# Patient Record
Sex: Female | Born: 1990 | Race: White | Hispanic: No | Marital: Married | Smoking: Never smoker
Health system: Southern US, Community
[De-identification: ages and names within clinical notes are randomized; demographics above are authoritative.]

## PROBLEM LIST (undated history)

## (undated) DIAGNOSIS — F419 Anxiety disorder, unspecified: Secondary | ICD-10-CM

## (undated) DIAGNOSIS — N946 Dysmenorrhea, unspecified: Secondary | ICD-10-CM

## (undated) DIAGNOSIS — S62109A Fracture of unspecified carpal bone, unspecified wrist, initial encounter for closed fracture: Secondary | ICD-10-CM

## (undated) DIAGNOSIS — T7840XA Allergy, unspecified, initial encounter: Secondary | ICD-10-CM

## (undated) DIAGNOSIS — F32A Depression, unspecified: Secondary | ICD-10-CM

## (undated) DIAGNOSIS — E781 Pure hyperglyceridemia: Secondary | ICD-10-CM

## (undated) DIAGNOSIS — R5383 Other fatigue: Secondary | ICD-10-CM

## (undated) DIAGNOSIS — E669 Obesity, unspecified: Secondary | ICD-10-CM

## (undated) DIAGNOSIS — F439 Reaction to severe stress, unspecified: Secondary | ICD-10-CM

## (undated) HISTORY — DX: Depression, unspecified: F32.A

## (undated) HISTORY — DX: Reaction to severe stress, unspecified: F43.9

## (undated) HISTORY — DX: Fracture of unspecified carpal bone, unspecified wrist, initial encounter for closed fracture: S62.109A

## (undated) HISTORY — DX: Allergy, unspecified, initial encounter: T78.40XA

## (undated) HISTORY — DX: Dysmenorrhea, unspecified: N94.6

## (undated) HISTORY — DX: Other fatigue: R53.83

## (undated) HISTORY — DX: Pure hyperglyceridemia: E78.1

## (undated) HISTORY — DX: Obesity, unspecified: E66.9

## (undated) HISTORY — PX: WISDOM TOOTH EXTRACTION: SHX21

## (undated) HISTORY — PX: TONSILLECTOMY: SUR1361

---
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
## 2011-03-20 ENCOUNTER — Telehealth: Payer: Self-pay | Admitting: *Deleted

## 2011-03-20 NOTE — Telephone Encounter (Signed)
Pt has a new baby coming into the family and it has been recommended that family members get tdap.  OK to schedule?

## 2011-03-20 NOTE — Telephone Encounter (Signed)
I do recommend that  Go ahead and schedule

## 2011-03-20 NOTE — Telephone Encounter (Signed)
I am forwarding this to [place] to schedule. Thank you.

## 2011-03-21 NOTE — Telephone Encounter (Signed)
Appt made for nurse visit

## 2011-03-22 ENCOUNTER — Ambulatory Visit (INDEPENDENT_AMBULATORY_CARE_PROVIDER_SITE_OTHER): Payer: Federal, State, Local not specified - PPO | Admitting: Family Medicine

## 2011-03-22 DIAGNOSIS — Z289 Immunization not carried out for unspecified reason: Secondary | ICD-10-CM

## 2011-03-22 NOTE — Progress Notes (Signed)
Patient came in today for nurse visit to get Tdap.  Her last Tdap was 05/2010 [facility] at [facility].  I advised patient that she was covered for 10 years and that she didn't need another injection.

## 2011-03-24 DIAGNOSIS — Z289 Immunization not carried out for unspecified reason: Secondary | ICD-10-CM | POA: Insufficient documentation

## 2011-03-24 NOTE — Progress Notes (Signed)
  Subjective:    Patient ID: Bethany Perkins, female    DOB: [birth year], 19 y.o.   MRN: [id number]  HPI    Review of Systems     Objective:   Physical Exam        Assessment & Plan:   No problem-specific assessment & plan notes found for this encounter.

## 2011-04-03 ENCOUNTER — Telehealth: Payer: Self-pay | Admitting: *Deleted

## 2011-04-03 NOTE — Telephone Encounter (Signed)
There is a new baby in the family and they were all advised to get the tdap. Mom is asking if patient can com in for this. Please advise.

## 2011-04-03 NOTE — Telephone Encounter (Signed)
Will send this note back to [facility] to schedule nurse visit.

## 2011-04-03 NOTE — Telephone Encounter (Signed)
Yes-that sounds like a good idea  Please make nurse appt for that

## 2011-04-03 NOTE — Telephone Encounter (Signed)
Nurse visit scheduled for tomorrow.

## 2011-04-04 ENCOUNTER — Ambulatory Visit (INDEPENDENT_AMBULATORY_CARE_PROVIDER_SITE_OTHER): Payer: Federal, State, Local not specified - PPO | Admitting: Family Medicine

## 2011-04-04 DIAGNOSIS — Z23 Encounter for immunization: Secondary | ICD-10-CM

## 2011-04-05 NOTE — Progress Notes (Signed)
  Subjective:    Patient ID: Bethany Perkins, female    DOB: [birth year], 20 y.o.   MRN: [id number]  HPI  Nurse visit.  Review of Systems     Objective:   Physical Exam        Assessment & Plan:

## 2011-04-11 ENCOUNTER — Encounter: Payer: Self-pay | Admitting: Family Medicine

## 2011-04-11 ENCOUNTER — Encounter: Payer: Federal, State, Local not specified - PPO | Admitting: Family Medicine

## 2011-04-12 NOTE — Progress Notes (Signed)
  Subjective:    Patient ID: Bethany Perkins, female    DOB: [birth year], 20 y.o.   MRN: [id number]  HPI  Cancelled   Review of Systems     Objective:   Physical Exam        Assessment & Plan:

## 2011-04-15 ENCOUNTER — Encounter: Payer: Self-pay | Admitting: Family Medicine

## 2011-04-15 ENCOUNTER — Ambulatory Visit (INDEPENDENT_AMBULATORY_CARE_PROVIDER_SITE_OTHER): Payer: Federal, State, Local not specified - PPO | Admitting: Family Medicine

## 2011-04-15 VITALS — BP 102/70 | HR 75 | Temp 98.5°F | Wt 138.5 lb

## 2011-04-15 DIAGNOSIS — J029 Acute pharyngitis, unspecified: Secondary | ICD-10-CM

## 2011-04-15 DIAGNOSIS — J020 Streptococcal pharyngitis: Secondary | ICD-10-CM

## 2011-04-15 MED ORDER — PENICILLIN V POTASSIUM 500 MG PO TABS: 500.0000 mg | ORAL_TABLET | Freq: Three times a day (TID) | ORAL | Status: AC

## 2011-04-15 NOTE — Progress Notes (Signed)
Subjective:     Bethany Perkins is a 20 y.o. female who presents for evaluation of sore throat. Associated symptoms include myalgias, post nasal drip, sinus and nasal congestion and sore throat. Onset of symptoms was 5 days ago, and have been unchanged since that time. She is drinking plenty of fluids. She has not had a recent close exposure to someone with proven streptococcal pharyngitis.  Does get recurrent strep pharyngitis infections.  Patient Active Problem List  Diagnoses  . STREPTOCOCCAL PHARYNGITIS  . ANXIETY DISORDER  . OTHER ACUTE SINUSITIS  . ASTHMA  . DYSMENORRHEA  . Immunization not carried out   Past Medical History  Diagnosis Date  . Allergy     allergic rhinitis seasonal  . Asthma     mild  . Dysmenorrhea   . Fx wrist 06/2003   No past surgical history on file. History  Substance Use Topics  . Smoking status: Never Smoker   . Smokeless tobacco: Not on file  . Alcohol Use: No   No family history on file. No Known Allergies Current Outpatient Prescriptions on File Prior to Visit  Medication Sig Dispense Refill  . drospirenone-ethinyl estradiol (YAZ,GIANVI,LORYNA) 3-0.02 MG tablet Take 1 tablet by mouth daily. at the same time every day       . Multiple Vitamin (MULTIVITAMIN) tablet Take 1 tablet by mouth daily.        Marland Kitchen albuterol (PROAIR HFA) 108 (90 BASE) MCG/ACT inhaler Inhale 2 puffs into the lungs every 4 (four) hours as needed.         The PMH, PSH, Social History, Family History, Medications, and allergies have been reviewed in [facility], and have been updated if relevant.   Review of Systems See HPI  Objective:  BP 102/70  Pulse 75  Temp(Src) 98.5 F (36.9 C) (Oral)  Wt 138 lb 8 oz (62.823 kg)  LMP 04/04/2011 Gen:  Alert, pleasant, NAD HEENT: enlarged, erythematous tonsils with white exudate, right>left, uvula midline Resp:  CTA bilaterally CVS:  RRR Ext:  No edema  Laboratory Strep test done. Results:positive.    Assessment:    Acute  pharyngitis, likely  Strep throat.    Plan:    Patient placed on antibiotics. Use of OTC analgesics recommended as well as salt water gargles. Patient advised that he will be infectious for 24 hours after starting antibiotics. ENT referral given recurrent strep pharyngitis

## 2011-08-07 ENCOUNTER — Telehealth: Payer: Self-pay | Admitting: Internal Medicine

## 2011-08-07 NOTE — Telephone Encounter (Signed)
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

## 2011-08-08 NOTE — Telephone Encounter (Signed)
Patient notified as instructed by telephone. Pt said just needs tb skin test. Scheduled nurse visit 08/14/11 at 11am.

## 2011-08-08 NOTE — Telephone Encounter (Signed)
If she needs a physical or exam , f/u with me  Otherwise make nurse visit for PPD (not on a Thursday)

## 2011-08-13 ENCOUNTER — Ambulatory Visit (INDEPENDENT_AMBULATORY_CARE_PROVIDER_SITE_OTHER): Payer: Federal, State, Local not specified - PPO | Admitting: Family Medicine

## 2011-08-13 ENCOUNTER — Encounter: Payer: Self-pay | Admitting: Family Medicine

## 2011-08-13 VITALS — BP 100/60 | HR 100 | Temp 98.6°F | Ht 68.0 in | Wt 133.0 lb

## 2011-08-13 DIAGNOSIS — J069 Acute upper respiratory infection, unspecified: Secondary | ICD-10-CM

## 2011-08-13 DIAGNOSIS — J029 Acute pharyngitis, unspecified: Secondary | ICD-10-CM

## 2011-08-13 NOTE — Progress Notes (Signed)
  Patient Name: Bethany Perkins Date of Birth: 1991/05/30 Medical Record Number: [id number]  History of Present Illness:  Patent presents with runny nose, sneezing, cough, sore throat, malaise and minimal / low-grade fever .  Chest congestion, tight chest, ST. No fever.  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  + recent exposure to others with similar symptoms.   The patent denies sore throat as the primary complaint. Denies sthortness of breath/wheezing, high fever, chest pain, rhinits for more than 14 days, significant myalgia, otalgia, facial pain, abdominal pain, changes in bowel or bladder.  PMH, PHS, Allergies, Problem List, Medications, Family History, and Social History have all been reviewed.  Review of Systems: as above, eating and drinking - tolerating PO. Urinating normally. No excessive vomitting or diarrhea. O/w as above.  Physical Exam:  Filed Vitals:   08/13/11 1139  BP: 100/60  Pulse: 100  Temp: 98.6 F (37 C)  TempSrc: Oral  Height: 5\' 8"  (1.727 m)  Weight: 133 lb (60.328 kg)  SpO2: 99%    GEN: WDWN, Non-toxic, Atraumatic, normocephalic. A and O x 3. HEENT: Oropharynx clear without exudate, MMM, no significant LAD, mild rhinnorhea Ears: TM clear, COL visualized with good landmarks CV: RRR, no m/g/r. Pulm: CTA B, no wheezes, rhonchi, or crackles, normal respiratory effort. EXT: no c/c/e Psych: well oriented, neither depressed nor anxious in appearance  A/P: 1. URI. Supportive care reviewed with patient. See patient instruction section.

## 2011-08-14 ENCOUNTER — Telehealth: Payer: Self-pay | Admitting: Internal Medicine

## 2011-08-14 ENCOUNTER — Ambulatory Visit (INDEPENDENT_AMBULATORY_CARE_PROVIDER_SITE_OTHER): Payer: Federal, State, Local not specified - PPO | Admitting: Family Medicine

## 2011-08-14 DIAGNOSIS — Z23 Encounter for immunization: Secondary | ICD-10-CM

## 2011-08-14 MED ORDER — OSELTAMIVIR PHOSPHATE 75 MG PO CAPS: 75.0000 mg | ORAL_CAPSULE | Freq: Two times a day (BID) | ORAL | Status: AC

## 2011-08-14 NOTE — Telephone Encounter (Signed)
Left message for patient to return my call.

## 2011-08-14 NOTE — Telephone Encounter (Signed)
Patient stated that she was seen yesterday by you and she is still coughing, chest congestion and body aches plus a low grade fever and wanted to know if you would call in an antibiotic to help her.

## 2011-08-14 NOTE — Telephone Encounter (Signed)
Spoke with patients mother and she now has fever and body aches. So tamiflu was called in.

## 2011-08-14 NOTE — Telephone Encounter (Signed)
Can you call her? If symptoms have changed, and it sounds like she has the flu now, then I am ok calling in Tamiflu. i don't think she has a bacterial infection, though.  If above, tamiflu 75 mg, 1 po bid, #10 If not, then none  discussed

## 2011-08-18 NOTE — Progress Notes (Signed)
  Subjective:    Patient ID: Bethany Perkins, female    DOB: October 26, 1990, 20 y.o.   MRN: [id number]  HPI  Here for PPD   Review of Systems     Objective:   Physical Exam        Assessment & Plan:

## 2011-09-06 ENCOUNTER — Other Ambulatory Visit: Payer: Self-pay | Admitting: *Deleted

## 2011-09-06 MED ORDER — DROSPIRENONE-ETHINYL ESTRADIOL 3-0.02 MG PO TABS: 1.0000 | ORAL_TABLET | Freq: Every day | ORAL | Status: DC

## 2011-09-06 NOTE — Telephone Encounter (Signed)
OK to refill? No pap or recent physical.

## 2011-09-06 NOTE — Telephone Encounter (Signed)
If it has been more than a year since a check up - please schedule one I usually do not do a first pap until 22 - new guidelines -- so likely will not do pap  Will refill electronically

## 2011-09-10 NOTE — Telephone Encounter (Signed)
Spoke with pt's mother and mother will contact her daughter to schedule a CPX. Bethany Perkins is currently out of town at [employer].

## 2012-01-03 ENCOUNTER — Telehealth: Payer: Self-pay | Admitting: Family Medicine

## 2012-01-03 NOTE — Telephone Encounter (Signed)
Caller: [name]/Mother; PCP: Roxy Manns A.; CB#: [phone];  Call regarding Mom Is Needing Immunization Records- Wondering If Copy Can Be Faxed To Home [phone]; PLEASE CALL AND LET HER KNOW IF CANNOT BE FAXED AND SHE CAN COME BY OFFICE AND PICK UP COPY. Bethany Perkins LEAVING TO GO OUT OF COUNTRY ON 01/07/12 AND NEEDS COPY TO BRING WITH HER.

## 2012-01-03 NOTE — Telephone Encounter (Signed)
Please send whatever imm records we have, thanks

## 2012-01-06 NOTE — Telephone Encounter (Signed)
Spoke with patients mom and advised that immunization records will be left at front desk for pick up.

## 2013-01-30 ENCOUNTER — Emergency Department (HOSPITAL_COMMUNITY)
Admission: EM | Admit: 2013-01-30 | Discharge: 2013-01-30 | Disposition: A | Discharge status: Home / Self Care | Payer: Federal, State, Local not specified - PPO | Source: Home / Self Care | Attending: Emergency Medicine | Admitting: Emergency Medicine

## 2013-01-30 ENCOUNTER — Emergency Department (INDEPENDENT_AMBULATORY_CARE_PROVIDER_SITE_OTHER): Payer: Federal, State, Local not specified - PPO

## 2013-01-30 ENCOUNTER — Encounter (HOSPITAL_COMMUNITY): Payer: Self-pay | Admitting: Emergency Medicine

## 2013-01-30 DIAGNOSIS — M84369A Stress fracture, unspecified tibia and fibula, initial encounter for fracture: Secondary | ICD-10-CM

## 2013-01-30 MED ORDER — NAPROXEN 500 MG PO TABS: 500.0000 mg | ORAL_TABLET | Freq: Two times a day (BID) | ORAL | Status: DC

## 2013-01-30 NOTE — ED Provider Notes (Signed)
History     CSN: [id number]  Arrival date & time 01/30/13  1114   First MD Initiated Contact with Patient 01/30/13 1130      Chief Complaint  Patient presents with  . Ankle Pain    left ankle pain since thursday night    (Consider location/radiation/quality/duration/timing/severity/associated sxs/prior treatment) HPI Comments: Pt presents c/o left ankle pain that started after running on Thursday 2 days ago.  Since then, she has had gradually increasing pain.  She ran a 5k this AM despite the pain and now it is much more painful.  She does admit to greatly increased level of activity in the past few weeks with running.  Denies any fever, chills, swelling, redness, or known injury    Past Medical History  Diagnosis Date  . Allergy     allergic rhinitis seasonal  . Asthma     mild  . Dysmenorrhea   . Fx wrist 06/2003    History reviewed. No pertinent past surgical history.  History reviewed. No pertinent family history.  History  Substance Use Topics  . Smoking status: Never Smoker   . Smokeless tobacco: Not on file  . Alcohol Use: No    OB History   Grav Para Term Preterm Abortions TAB SAB Ect Mult Living                  Review of Systems  Constitutional: Negative for fever and chills.  Eyes: Negative for visual disturbance.  Respiratory: Negative for cough and shortness of breath.   Cardiovascular: Negative for chest pain, palpitations and leg swelling.  Gastrointestinal: Negative for nausea, vomiting and abdominal pain.  Endocrine: Negative for polydipsia and polyuria.  Genitourinary: Negative for dysuria, urgency and frequency.  Musculoskeletal: Positive for arthralgias (see HPI). Negative for myalgias.  Skin: Negative for rash.  Neurological: Negative for dizziness, weakness and light-headedness.    Allergies  Review of patient's allergies indicates no known allergies.  Home Medications   Current Outpatient Rx  Name  Route  Sig  Dispense  Refill  .  dextromethorphan-guaiFENesin (MUCINEX DM) 30-600 MG per 12 hr tablet   Oral   Take 1 tablet by mouth every 12 (twelve) hours.           . drospirenone-ethinyl estradiol (YAZ,GIANVI,LORYNA) 3-0.02 MG tablet   Oral   Take 1 tablet by mouth daily. at the same time every day   1 Package   11   . naproxen (NAPROSYN) 500 MG tablet   Oral   Take 1 tablet (500 mg total) by mouth 2 (two) times daily.   60 tablet   0   . naproxen sodium (ANAPROX) 220 MG tablet   Oral   Take 220 mg by mouth 2 (two) times daily with a meal.             BP 124/82  Pulse 101  Temp(Src) 98.7 F (37.1 C) (Oral)  Resp 20  SpO2 100%  LMP 01/15/2013  Physical Exam  Constitutional: She is oriented to person, place, and time. She appears well-developed and well-nourished. No distress.  HENT:  Head: Normocephalic and atraumatic.  Pulmonary/Chest: Effort normal.  Musculoskeletal:       Left ankle: She exhibits normal range of motion, no swelling, no ecchymosis, no deformity, no laceration and normal pulse. Tenderness. Lateral malleolus and AITFL tenderness found. No medial malleolus, no CF ligament, no posterior TFL, no head of 5th metatarsal and no proximal fibula tenderness found. Achilles tendon normal.  Compression  of the forefoot causes pain posterior to the lateral malleolus.  Palpation along the ATFL also causes pain posterior to the lateral malleolus.    Neurological: She is alert and oriented to person, place, and time.  Skin: Skin is warm and dry.  Psychiatric: She has a normal mood and affect. Judgment normal.    ED Course  Procedures (including critical care time)  Labs Reviewed - No data to display Dg Ankle Complete Left  01/30/2013   **ADDENDUM** CREATED: 01/30/2013 12:32:50  On additional review, there is possible mild disruption of the lateral cortical margin along the distal fibular shaft.  This appearance is compatible with the reported history of stress fracture.  On discussion with Dr.  Excell Seltzer, this corresponds to the site of the patient's pain.  ADDENDED IMPRESSION:  Suspected stress fracture along the lateral margin of the distal fibula.  **END ADDENDUM** SIGNED BY: Charline Bills, M.D.  01/30/2013   *RADIOLOGY REPORT*  Clinical Data: Running injury, ankle pain  LEFT ANKLE COMPLETE - 3+ VIEW  Comparison: None.  Findings: No fracture or dislocation is seen.  The ankle mortise is intact.  The base of the fifth metatarsal is unremarkable.  The visualized soft tissues are unremarkable.  IMPRESSION: No fracture or dislocation is seen.   Original Report Authenticated By: Charline Bills, M.D.     1. Stress fracture of fibula, initial encounter       MDM  Pt placed a boot and could then walk without discomfort.  Will treat symptomatically with NSAIDs and refer to ortho    Meds ordered this encounter  Medications  . naproxen (NAPROSYN) 500 MG tablet    Sig: Take 1 tablet (500 mg total) by mouth 2 (two) times daily.    Dispense:  60 tablet    Refill:  0           Graylon Good, PA-C 01/30/13 1826

## 2013-01-30 NOTE — ED Notes (Signed)
Waiting discharge papers 

## 2013-01-30 NOTE — ED Provider Notes (Signed)
Medical screening examination/treatment/procedure(s) were performed by non-physician practitioner and as supervising physician I was immediately available for consultation/collaboration.  Leslee Home, M.D.  Reuben Likes, MD 01/30/13 [phone]

## 2013-01-30 NOTE — ED Notes (Signed)
Pt c/o left ankle pain that started Thursday night after running. Pt ran a 5k race today and pain has increased. Pt has used a brace with no relief.  Hx of sprained ankle but can't remember which ankle.

## 2013-03-03 ENCOUNTER — Encounter: Payer: Self-pay | Admitting: Family Medicine

## 2013-03-03 ENCOUNTER — Ambulatory Visit (INDEPENDENT_AMBULATORY_CARE_PROVIDER_SITE_OTHER): Payer: Federal, State, Local not specified - PPO | Admitting: Family Medicine

## 2013-03-03 VITALS — BP 104/64 | HR 96 | Temp 98.6°F | Ht 68.0 in | Wt 140.0 lb

## 2013-03-03 DIAGNOSIS — R5381 Other malaise: Secondary | ICD-10-CM | POA: Insufficient documentation

## 2013-03-03 DIAGNOSIS — M84375A Stress fracture, left foot, initial encounter for fracture: Secondary | ICD-10-CM

## 2013-03-03 DIAGNOSIS — M8430XA Stress fracture, unspecified site, initial encounter for fracture: Secondary | ICD-10-CM

## 2013-03-03 DIAGNOSIS — R630 Anorexia: Secondary | ICD-10-CM

## 2013-03-03 DIAGNOSIS — R5383 Other fatigue: Secondary | ICD-10-CM | POA: Insufficient documentation

## 2013-03-03 NOTE — Progress Notes (Signed)
Subjective:    Patient ID: Bethany Perkins, female    DOB: 01-31-1991, 22 y.o.   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, sports - going to school at [employer] figure out what she wants to do  Taking 3 classes right now  Not working (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 Immunization not carried out 03/24/2011  . ANXIETY DISORDER 04/24/2010  . DYSMENORRHEA 08/11/2007  . ASTHMA 03/18/2007   Past Medical History  Diagnosis Date  . Allergy     allergic rhinitis seasonal  . Asthma     mild  . Dysmenorrhea   . Fx wrist 06/2003   No past surgical history on file. History  Substance Use Topics  . Smoking status: Never Smoker   . Smokeless tobacco: Not on file  . Alcohol Use: No   No family history on file. No Known Allergies Current Outpatient Prescriptions on File Prior to Visit  Medication Sig Dispense Refill  . drospirenone-ethinyl estradiol (YAZ,GIANVI,LORYNA) 3-0.02 MG tablet Take 1  tablet by mouth daily. at the same time every day  1 Package  11  . naproxen (NAPROSYN) 500 MG tablet Take 1 tablet (500 mg total) by mouth 2 (two) times daily.  60 tablet  0   No current facility-administered medications on file prior to visit.    Review of Systems Review of Systems  Constitutional: Negative for fever, pos for fatigue and loss of appetite .  Eyes: Negative for pain and visual disturbance. ENT pos for brief ST and neg for upper resp symptoms  Respiratory: Negative for cough and shortness of breath.   Cardiovascular: Negative for cp or palpitations    Gastrointestinal: Negative for nausea, diarrhea and constipation.  Genitourinary: Negative for urgency and frequency.  Skin: Negative for pallor or rash  neg for tick bites Neurological: Negative for weakness, light-headedness, numbness and headaches.  Hematological: Negative for adenopathy. Does not bruise/bleed easily.  Psychiatric/Behavioral: Negative for dysphoric mood. The patient is not nervous/anxious.         Objective:   Physical Exam  Constitutional: She is oriented to person, place, and time. She appears well-developed and well-nourished. No distress.  HENT:  Head: Normocephalic and atraumatic.  Right Ear: External ear normal.  Left Ear: External ear normal.  Nose: Nose normal.  Mouth/Throat: Oropharynx is clear and moist.  Eyes: Conjunctivae and EOM are normal. Pupils are equal, round, and reactive to light. Right eye exhibits no discharge. Left eye exhibits no discharge. No scleral icterus.  Neck: Normal range of motion. Neck supple. No JVD present. No thyromegaly present.  Cardiovascular: Normal rate, regular rhythm, normal heart sounds and intact distal pulses.  Exam reveals no gallop.   Pulmonary/Chest: Effort normal and breath sounds normal. No respiratory distress. She has no wheezes. She exhibits no tenderness.  Abdominal: Soft. Bowel sounds are normal. She exhibits no distension and no mass. There is  no hepatosplenomegaly. There is no tenderness.  Musculoskeletal: Normal range of motion. She exhibits no edema and no tenderness.  Lymphadenopathy:    She has no cervical adenopathy.  Neurological: She is alert and oriented to person, place, and time. She has normal reflexes. She displays no tremor. No cranial nerve deficit. She exhibits normal muscle tone. Coordination normal.  Skin: Skin is warm and dry. No rash noted. No erythema. No pallor.  Psychiatric: She has a normal mood and affect.          Assessment & Plan:

## 2013-03-03 NOTE — Assessment & Plan Note (Signed)
Pt in a boot -less active  Disc upper body exercise or swimming (if ok with ortho)- until it heals

## 2013-03-03 NOTE — Assessment & Plan Note (Signed)
See assessment for fatigue

## 2013-03-03 NOTE — Assessment & Plan Note (Signed)
With poor appetite Pt denies depression and screen is neg-however she does have a lot of life changes and stressors lately as well as less activity Lab today-incl mono screen Disc poss of post viral syndrome or decontitioning

## 2013-03-03 NOTE — Patient Instructions (Addendum)
Labs today Try to get some upper body exercise  Swimming included  Make yourself eat 3 meals per day Follow up in about 2 weeks and we will do gyn exam also (30 min appt )

## 2013-03-04 LAB — COMPREHENSIVE METABOLIC PANEL
AST: 14 U/L (ref 0–37)
Albumin: 4.3 g/dL (ref 3.5–5.2)
Alkaline Phosphatase: 61 U/L (ref 39–117)
BUN: 10 mg/dL (ref 6–23)
Potassium: 4.3 mEq/L (ref 3.5–5.1)
Sodium: 134 mEq/L — ABNORMAL LOW (ref 135–145)
Total Bilirubin: 0.5 mg/dL (ref 0.3–1.2)
Total Protein: 7.4 g/dL (ref 6.0–8.3)

## 2013-03-04 LAB — CBC WITH DIFFERENTIAL/PLATELET
Eosinophils Absolute: 0.2 10*3/uL (ref 0.0–0.7)
HCT: 39.8 % (ref 36.0–46.0)
Lymphs Abs: 2.6 10*3/uL (ref 0.7–4.0)
MCHC: 34.6 g/dL (ref 30.0–36.0)
MCV: 91.8 fl (ref 78.0–100.0)
Monocytes Absolute: 0.8 10*3/uL (ref 0.1–1.0)
Neutrophils Relative %: 64.6 % (ref 43.0–77.0)
Platelets: 295 10*3/uL (ref 150.0–400.0)

## 2013-03-04 LAB — VITAMIN B12: Vitamin B-12: 255 pg/mL (ref 211–911)

## 2013-03-04 LAB — TSH: TSH: 0.6 u[IU]/mL (ref 0.35–5.50)

## 2013-03-05 ENCOUNTER — Encounter: Payer: Self-pay | Admitting: *Deleted

## 2013-03-19 ENCOUNTER — Ambulatory Visit: Payer: Federal, State, Local not specified - PPO | Admitting: Family Medicine

## 2013-03-23 ENCOUNTER — Other Ambulatory Visit (HOSPITAL_COMMUNITY)
Admission: RE | Admit: 2013-03-23 | Discharge: 2013-03-23 | Disposition: A | Discharge status: Home / Self Care | Payer: Federal, State, Local not specified - PPO | Source: Ambulatory Visit | Attending: Family Medicine | Admitting: Family Medicine

## 2013-03-23 ENCOUNTER — Encounter: Payer: Self-pay | Admitting: Family Medicine

## 2013-03-23 ENCOUNTER — Ambulatory Visit (INDEPENDENT_AMBULATORY_CARE_PROVIDER_SITE_OTHER): Payer: Federal, State, Local not specified - PPO | Admitting: Family Medicine

## 2013-03-23 VITALS — BP 116/80 | HR 110 | Temp 98.5°F | Ht 68.0 in | Wt 137.5 lb

## 2013-03-23 DIAGNOSIS — Z113 Encounter for screening for infections with a predominantly sexual mode of transmission: Secondary | ICD-10-CM

## 2013-03-23 DIAGNOSIS — R630 Anorexia: Secondary | ICD-10-CM

## 2013-03-23 DIAGNOSIS — R5383 Other fatigue: Secondary | ICD-10-CM

## 2013-03-23 DIAGNOSIS — Z01419 Encounter for gynecological examination (general) (routine) without abnormal findings: Secondary | ICD-10-CM

## 2013-03-23 DIAGNOSIS — R5381 Other malaise: Secondary | ICD-10-CM

## 2013-03-23 MED ORDER — DROSPIRENONE-ETHINYL ESTRADIOL 3-0.02 MG PO TABS: 1.0000 | ORAL_TABLET | Freq: Every day | ORAL | Status: DC

## 2013-03-23 NOTE — Assessment & Plan Note (Signed)
Will add gc/ chlamydia screen to pap No symptoms or problems

## 2013-03-23 NOTE — Assessment & Plan Note (Signed)
Much improved now back to work

## 2013-03-23 NOTE — Patient Instructions (Addendum)
Take care of yourself  Pap done today- we will get you that result and other test result  Don't smoke ever Continue current oral contraceptive

## 2013-03-23 NOTE — Progress Notes (Signed)
Subjective:    Patient ID: Bethany Perkins, female    DOB: 1991/06/24, 22 y.o.   MRN: [id number]  HPI Here for routine gyn exam  Wt is down another 3 lb  Is trying to eat more - is including breakfast  Not as tired  Starting working again-feels a bit more back to normal   Did have a migraine last night   Is on Yaz  peroids are not too bad - lasts 4-5 days  Not sexually active currently - not in a few years  No symptoms or problems  Wants STD screen   Family hx of Breast ca in [facility]  Has had HPV vaccine   Patient Active Problem List   Diagnosis Date Noted  . Encounter for routine gynecological examination 03/23/2013  . Stress fracture of left foot 03/03/2013  . Other malaise and fatigue 03/03/2013  . Poor appetite 03/03/2013  . Immunization not carried out 03/24/2011  . ANXIETY DISORDER 04/24/2010  . DYSMENORRHEA 08/11/2007  . ASTHMA 03/18/2007   Past Medical History  Diagnosis Date  . Allergy     allergic rhinitis seasonal  . Asthma     mild  . Dysmenorrhea   . Fx wrist 06/2003   No past surgical history on file. History  Substance Use Topics  . Smoking status: Never Smoker   . Smokeless tobacco: Not on file  . Alcohol Use: No   No family history on file. No Known Allergies Current Outpatient Prescriptions on File Prior to Visit  Medication Sig Dispense Refill  . drospirenone-ethinyl estradiol (YAZ,GIANVI,LORYNA) 3-0.02 MG tablet Take 1 tablet by mouth daily. at the same time every day  1 Package  11  . naproxen (NAPROSYN) 500 MG tablet Take 1 tablet (500 mg total) by mouth 2 (two) times daily.  60 tablet  0   No current facility-administered medications on file prior to visit.       Review of Systems    Review of Systems  Constitutional: Negative for fever, pos for fatigue (improved) and improvement in appetite  Eyes: Negative for pain and visual disturbance.  Respiratory: Negative for cough and shortness of breath.   Cardiovascular: Negative for cp  or palpitations    Gastrointestinal: Negative for nausea, diarrhea and constipation.  Genitourinary: Negative for urgency and frequency. neg for vaginal discharge or pelvic pain  Skin: Negative for pallor or rash   Neurological: Negative for weakness, light-headedness, numbness and headaches.  Hematological: Negative for adenopathy. Does not bruise/bleed easily.  Psychiatric/Behavioral: Negative for dysphoric mood. The patient is not nervous/anxious.      Objective:   Physical Exam  Constitutional: She appears well-developed and well-nourished. No distress.  HENT:  Head: Normocephalic and atraumatic.  Mouth/Throat: Oropharynx is clear and moist.  Eyes: Conjunctivae and EOM are normal. Pupils are equal, round, and reactive to light.  Cardiovascular: Regular rhythm.   Abdominal: Soft. Bowel sounds are normal. She exhibits no distension and no mass. There is no tenderness.  Genitourinary: Uterus normal. No breast swelling, tenderness, discharge or bleeding. There is no rash, tenderness or lesion on the right labia. There is no rash, tenderness or lesion on the left labia. Uterus is not enlarged and not tender. Cervix exhibits no motion tenderness, no discharge and no friability. Right adnexum displays no mass, no tenderness and no fullness. Left adnexum displays no mass, no tenderness and no fullness. No tenderness or bleeding around the vagina. Vaginal discharge found.  Mild clear vaginal discharge No lesions or tenderness  Breast exam: No mass, nodules, thickening, tenderness, bulging, retraction, inflamation, nipple discharge or skin changes noted.  No axillary or clavicular LA.  Chaperoned exam.    Musculoskeletal: She exhibits no edema.  Neurological: She is alert.  Skin: Skin is warm and dry. No pallor.  Psychiatric: She has a normal mood and affect.          Assessment & Plan:

## 2013-03-23 NOTE — Assessment & Plan Note (Signed)
Reviewed labs - nl except for low sugar- enc to eat more frequent meals Per pt is doing better Will continue to follow

## 2013-03-23 NOTE — Assessment & Plan Note (Signed)
Annual exam with pap Refilled OC- doing well with menses  Warned not to smoke with this  Adv condom use when sexually active to prevent STDs

## 2013-03-29 ENCOUNTER — Encounter: Payer: Self-pay | Admitting: *Deleted

## 2013-05-19 ENCOUNTER — Ambulatory Visit (INDEPENDENT_AMBULATORY_CARE_PROVIDER_SITE_OTHER): Payer: Federal, State, Local not specified - PPO | Admitting: Family Medicine

## 2013-05-19 ENCOUNTER — Encounter: Payer: Self-pay | Admitting: Family Medicine

## 2013-05-19 VITALS — BP 100/64 | HR 94 | Temp 98.5°F | Ht 68.0 in | Wt 146.2 lb

## 2013-05-19 DIAGNOSIS — J029 Acute pharyngitis, unspecified: Secondary | ICD-10-CM

## 2013-05-19 DIAGNOSIS — J069 Acute upper respiratory infection, unspecified: Secondary | ICD-10-CM

## 2013-05-19 LAB — POCT RAPID STREP A (OFFICE): Rapid Strep A Screen: NEGATIVE

## 2013-05-19 MED ORDER — GUAIFENESIN-CODEINE 100-10 MG/5ML PO SYRP: 5.0000 mL | ORAL_SOLUTION | Freq: Four times a day (QID) | ORAL | Status: DC | PRN

## 2013-05-19 NOTE — Progress Notes (Signed)
  Subjective:    Patient ID: Bethany Perkins, female    DOB: 14-Apr-1991, 22 y.o.   MRN: [id number]  HPI Here with ST and resp symptoms   Chest is tight Prod cough- yellow mucous  ? Wheeze Sneezing and stuffiness  No headache Low grade fever on and off - naproxen  Also mucinex  Drinking lots of water   Patient Active Problem List   Diagnosis Date Noted  . Encounter for routine gynecological examination 03/23/2013  . Screen for STD (sexually transmitted disease) 03/23/2013  . Stress fracture of left foot 03/03/2013  . Other malaise and fatigue 03/03/2013  . Poor appetite 03/03/2013  . Immunization not carried out 03/24/2011  . ANXIETY DISORDER 04/24/2010  . DYSMENORRHEA 08/11/2007  . ASTHMA 03/18/2007   Past Medical History  Diagnosis Date  . Allergy     allergic rhinitis seasonal  . Asthma     mild  . Dysmenorrhea   . Fx wrist 06/2003   No past surgical history on file. History  Substance Use Topics  . Smoking status: Never Smoker   . Smokeless tobacco: Never Used  . Alcohol Use: No   No family history on file. No Known Allergies Current Outpatient Prescriptions on File Prior to Visit  Medication Sig Dispense Refill  . drospirenone-ethinyl estradiol (YAZ,GIANVI,LORYNA) 3-0.02 MG tablet Take 1 tablet by mouth daily. at the same time every day  1 Package  11  . naproxen (NAPROSYN) 500 MG tablet Take 1 tablet (500 mg total) by mouth 2 (two) times daily.  60 tablet  0   No current facility-administered medications on file prior to visit.     Review of Systems Review of Systems  Constitutional: Negative for fever, appetite change,  and unexpected weight change.  ENT pos for ST and cong and rhinorrhea/ neg for facial pain  Eyes: Negative for pain and visual disturbance.  Respiratory: Negative for wheeze  and shortness of breath.   Cardiovascular: Negative for cp or palpitations    Gastrointestinal: Negative for nausea, diarrhea and constipation.  Genitourinary:  Negative for urgency and frequency.  Skin: Negative for pallor or rash   Neurological: Negative for weakness, light-headedness, numbness and headaches.  Hematological: Negative for adenopathy. Does not bruise/bleed easily.  Psychiatric/Behavioral: Negative for dysphoric mood. The patient is not nervous/anxious.         Objective:   Physical Exam  Constitutional: She appears well-developed and well-nourished. No distress.  HENT:  Head: Normocephalic and atraumatic.  Right Ear: External ear normal.  Left Ear: External ear normal.  Mouth/Throat: Oropharynx is clear and moist. No oropharyngeal exudate.  Nares are injected and congested  Post throat injection with clear post nasal drip  No facial tenderness  Eyes: Conjunctivae and EOM are normal. Pupils are equal, round, and reactive to light. Right eye exhibits no discharge. Left eye exhibits no discharge. No scleral icterus.  Neck: Normal range of motion. Neck supple. No JVD present. No thyromegaly present.  Cardiovascular: Normal rate, regular rhythm and normal heart sounds.   Pulmonary/Chest: Effort normal and breath sounds normal. No respiratory distress. She has no wheezes. She has no rales.  Lymphadenopathy:    She has no cervical adenopathy.  Skin: Skin is warm and dry. No rash noted. No pallor.  Psychiatric: She has a normal mood and affect.          Assessment & Plan:

## 2013-05-19 NOTE — Patient Instructions (Addendum)
I think you have a viral head and chest cold Try the robitussin AC at night - it is sedating (try mucinex DM during the day )  Fluids/ rest/ nasal saline/ naproxen for fever or aches or headache Update if not starting to improve in a week or if worsening  - especially if wheezing or higher fever

## 2013-05-20 NOTE — Assessment & Plan Note (Signed)
Disc symptomatic care - see instructions on AVS Handout given  Robitussin AC for night Update if not starting to improve in a week or if worsening

## 2013-09-08 ENCOUNTER — Ambulatory Visit (INDEPENDENT_AMBULATORY_CARE_PROVIDER_SITE_OTHER): Payer: Federal, State, Local not specified - PPO | Admitting: Family Medicine

## 2013-09-08 ENCOUNTER — Encounter: Payer: Self-pay | Admitting: Family Medicine

## 2013-09-08 VITALS — BP 108/72 | HR 102 | Temp 98.1°F | Ht 68.0 in | Wt 154.5 lb

## 2013-09-08 DIAGNOSIS — L678 Other hair color and hair shaft abnormalities: Secondary | ICD-10-CM

## 2013-09-08 DIAGNOSIS — L738 Other specified follicular disorders: Secondary | ICD-10-CM

## 2013-09-08 DIAGNOSIS — L731 Pseudofolliculitis barbae: Secondary | ICD-10-CM

## 2013-09-08 DIAGNOSIS — Z23 Encounter for immunization: Secondary | ICD-10-CM

## 2013-09-08 NOTE — Patient Instructions (Signed)
I think you have an ingrown hair  Try not to shave in the pubic area unless you have to  Try some warm compresses / warm baths  If much worse/ red/ painful - let me know  It may come to a head and drain- is ok / just keep clean with antibacterial soap and water

## 2013-09-08 NOTE — Progress Notes (Signed)
Pre-visit discussion using our clinic review tool. No additional management support is needed unless otherwise documented below in the visit note.  

## 2013-09-08 NOTE — Progress Notes (Signed)
   Subjective:    Patient ID: Bethany Perkins, female    DOB: 02-01-1991, 23 y.o.   MRN: [id number]  HPI Here with a lump in groin  Hard and oval shaped  Has had it 2 mo  Now looks a little discolored and painful   Not draining   Feeling ok  No vaginal discharge or exposure to STDs  No fever  No other spots like it   Patient Active Problem List   Diagnosis Date Noted  . Ingrown hair 09/08/2013  . Viral URI with cough 05/19/2013  . Encounter for routine gynecological examination 03/23/2013  . Screen for STD (sexually transmitted disease) 03/23/2013  . Stress fracture of left foot 03/03/2013  . Other malaise and fatigue 03/03/2013  . Poor appetite 03/03/2013  . Immunization not carried out 03/24/2011  . ANXIETY DISORDER 04/24/2010  . DYSMENORRHEA 08/11/2007  . ASTHMA 03/18/2007   Past Medical History  Diagnosis Date  . Allergy     allergic rhinitis seasonal  . Asthma     mild  . Dysmenorrhea   . Fx wrist 06/2003   No past surgical history on file. History  Substance Use Topics  . Smoking status: Never Smoker   . Smokeless tobacco: Never Used  . Alcohol Use: No   No family history on file. No Known Allergies Current Outpatient Prescriptions on File Prior to Visit  Medication Sig Dispense Refill  . drospirenone-ethinyl estradiol (YAZ,GIANVI,LORYNA) 3-0.02 MG tablet Take 1 tablet by mouth daily. at the same time every day  1 Package  11  . naproxen (NAPROSYN) 500 MG tablet Take 1 tablet (500 mg total) by mouth 2 (two) times daily.  60 tablet  0   No current facility-administered medications on file prior to visit.    Review of Systems Review of Systems  Constitutional: Negative for fever, appetite change, fatigue and unexpected weight change.  Eyes: Negative for pain and visual disturbance.  Respiratory: Negative for cough and shortness of breath.   Cardiovascular: Negative for cp or palpitations    Gastrointestinal: Negative for nausea, diarrhea and  constipation.  Genitourinary: Negative for urgency and frequency. neg for vaginal d/c or exp to STD Skin: Negative for pallor or rash   Neurological: Negative for weakness, light-headedness, numbness and headaches.  Hematological: Negative for adenopathy. Does not bruise/bleed easily.  Psychiatric/Behavioral: Negative for dysphoric mood. The patient is not nervous/anxious.         Objective:   Physical Exam  Constitutional: She appears well-developed and well-nourished.  HENT:  Head: Atraumatic.  Neck: Neck supple.  Genitourinary: No vaginal discharge found.  Musculoskeletal: She exhibits no edema.  Lymphadenopathy:    She has no cervical adenopathy.  Neurological: She is alert. She has normal reflexes.  Skin: Skin is warm and dry. No rash noted. No erythema.  Marland Kitchen.5-1cm oval superficial skin cyst that is mobile and mildly tender as well as hyperpigmentation Resembles ingrown hair / less likely hydraadenitis No drainage  Psychiatric: She has a normal mood and affect.          Assessment & Plan:

## 2013-09-09 NOTE — Assessment & Plan Note (Signed)
In R groin  Enc not to shave  Warm compresses/ and abx oint if it becomes red or opens  Update if not starting to improve in a week or if worsening

## 2013-10-08 ENCOUNTER — Ambulatory Visit (INDEPENDENT_AMBULATORY_CARE_PROVIDER_SITE_OTHER): Payer: Federal, State, Local not specified - PPO | Admitting: Internal Medicine

## 2013-10-08 ENCOUNTER — Encounter: Payer: Self-pay | Admitting: Internal Medicine

## 2013-10-08 VITALS — BP 110/74 | HR 92 | Temp 98.8°F | Wt 153.0 lb

## 2013-10-08 DIAGNOSIS — J069 Acute upper respiratory infection, unspecified: Secondary | ICD-10-CM

## 2013-10-08 MED ORDER — AZITHROMYCIN 250 MG PO TABS: ORAL_TABLET | ORAL | Status: DC

## 2013-10-08 NOTE — Patient Instructions (Addendum)

## 2013-10-08 NOTE — Progress Notes (Signed)
Pre-visit discussion using our clinic review tool. No additional management support is needed unless otherwise documented below in the visit note.  

## 2013-10-08 NOTE — Progress Notes (Signed)
HPI  Pt presents to the clinic today with c/o cough and chest congestion. This started 4 days ago. The cough is productive of thick green/yellow mucous. She also c/o associated nasal congestion, runny nose and sore throat. She denies fever but has had chills and body aches. She has taken OTC Mucinex without much relief. She feels like her symptoms are getting worse. She does have a history of seasonal allergies and asthma. She has had sick contacts.  Review of Systems      Past Medical History  Diagnosis Date  . Allergy     allergic rhinitis seasonal  . Asthma     mild  . Dysmenorrhea   . Fx wrist 06/2003    History reviewed. No pertinent family history.  History   Social History  . Marital Status: Single    Spouse Name: N/A    Number of Children: N/A  . Years of Education: N/A   Occupational History  . Not on file.   Social History Main Topics  . Smoking status: Never Smoker   . Smokeless tobacco: Never Used  . Alcohol Use: No  . Drug Use: No  . Sexual Activity: Yes    Birth Control/ Protection: Condom   Other Topics Concern  . Not on file   Social History Narrative  . No narrative on file    No Known Allergies   Constitutional: Positive headache, fatigue. Denies fever or  abrupt weight changes.  HEENT:  Positive sore throat, nasal congestion, runny nose. Denies eye redness, eye pain, pressure behind the eyes, facial pain, ear pain, ringing in the ears, wax buildup, or bloody nose. Respiratory: Positive cough. Denies difficulty breathing or shortness of breath.  Cardiovascular: Denies chest pain, chest tightness, palpitations or swelling in the hands or feet.   No other specific complaints in a complete review of systems (except as listed in HPI above).  Objective:   BP 110/74  Pulse 92  Temp(Src) 98.8 F (37.1 C) (Oral)  Wt 153 lb (69.4 kg)  SpO2 98%  LMP 08/30/2013 Wt Readings from Last 3 Encounters:  10/08/13 153 lb (69.4 kg)  09/08/13 154 lb 8  oz (70.081 kg)  05/19/13 146 lb 4 oz (66.339 kg)     General: Appears her stated age, well developed, well nourished in NAD. HEENT: Head: normal shape and size; Eyes: sclera white, no icterus, conjunctiva pink, PERRLA and EOMs intact; Ears: Tm's gray and intact, normal light reflex; Nose: mucosa pink and moist, septum midline; Throat/Mouth: + PND. Teeth present, mucosa erythematous and moist, no exudate noted, no lesions or ulcerations noted.  Neck: Mild cervical lymphadenopathy. Neck supple, trachea midline. No massses, lumps or thyromegaly present.  Cardiovascular: Normal rate and rhythm. S1,S2 noted.  No murmur, rubs or gallops noted. No JVD or BLE edema. No carotid bruits noted. Pulmonary/Chest: Normal effort and positive vesicular breath sounds. No respiratory distress. No wheezes, rales or ronchi noted.      Assessment & Plan:   Upper Respiratory Infection:  Get some rest and drink plenty of water Do salt water gargles for the sore throat eRx for Azithromax x 5 days Take OTC Allegra and Delsym as needed for cough  RTC as needed or if symptoms persist.

## 2013-10-14 ENCOUNTER — Ambulatory Visit (INDEPENDENT_AMBULATORY_CARE_PROVIDER_SITE_OTHER): Payer: Federal, State, Local not specified - PPO | Admitting: Internal Medicine

## 2013-10-14 ENCOUNTER — Encounter: Payer: Self-pay | Admitting: Internal Medicine

## 2013-10-14 VITALS — BP 112/70 | HR 113 | Temp 98.8°F | Wt 154.8 lb

## 2013-10-14 DIAGNOSIS — J019 Acute sinusitis, unspecified: Secondary | ICD-10-CM

## 2013-10-14 MED ORDER — AMOXICILLIN-POT CLAVULANATE 875-125 MG PO TABS: 1.0000 | ORAL_TABLET | Freq: Two times a day (BID) | ORAL | Status: DC

## 2013-10-14 NOTE — Progress Notes (Signed)
HPI  Pt presents to the clinic today with c/o of sinus infection. She was recently treated for URI with Zpack that she finished Tuesday. Her chest symptoms have improved, however now she is having sinus pressure, facial pain, headache, nasal congestion, and chills. These symptoms started about 5 days ago and have not improved. She has tried PhotographerTC Allegra without any relief. She denies fatigue, ear pain, sore throat, or shortness of breath.   Review of Systems    Past Medical History  Diagnosis Date  . Allergy     allergic rhinitis seasonal  . Asthma     mild  . Dysmenorrhea   . Fx wrist 06/2003    History reviewed. No pertinent family history.  History   Social History  . Marital Status: Single    Spouse Name: N/A    Number of Children: N/A  . Years of Education: N/A   Occupational History  . Not on file.   Social History Main Topics  . Smoking status: Never Smoker   . Smokeless tobacco: Never Used  . Alcohol Use: No  . Drug Use: No  . Sexual Activity: Yes    Birth Control/ Protection: Condom   Other Topics Concern  . Not on file   Social History Narrative  . No narrative on file    No Known Allergies   Constitutional: Positive headache, fatigue and fever. Denies abrupt weight changes.  HEENT:  Positive eye pain, pressure behind the eyes, facial pain, nasal congestion. Denies eye redness, ear pain, ringing in the ears, wax buildup, runny nose or bloody nose. Respiratory: Denies cough difficulty breathing or shortness of breath.  Cardiovascular: Denies chest pain, chest tightness, palpitations or swelling in the hands or feet.   No other specific complaints in a complete review of systems (except as listed in HPI above).  Objective:  BP 112/70  Pulse 113  Temp(Src) 98.8 F (37.1 C) (Oral)  Wt 154 lb 12.8 oz (70.217 kg)  SpO2 98%   General: Appears her stated age, well developed, well nourished in NAD. HEENT: Head: normal shape and size, ethmoid and  maxillary sinus tenderness noted; Eyes: sclera white, no icterus, conjunctiva pink, PERRLA and EOMs intact; Ears: Tm's gray and intact, normal light reflex, + effusion bilaterally; Nose: mucosa pink and moist, septum midline; Throat/Mouth: + PND. Teeth present, mucosa pink and moist, no exudate noted, no lesions or ulcerations noted.  Neck: Neck supple, trachea midline. No massses, lumps or thyromegaly present.  Cardiovascular: Normal rate and rhythm. S1,S2 noted.  No murmur, rubs or gallops noted. No JVD or BLE edema. No carotid bruits noted. Pulmonary/Chest: Normal effort and positive vesicular breath sounds. No respiratory distress. No wheezes, rales or ronchi noted.      Assessment & Plan:   Acute bacterial sinusitis  Can use a Neti Pot which can be purchased from your local drug store. Flonase 2 sprays each nostril for 3 days and then as needed. Augmentin BID for 10 days Continue Allegra OTC Ibuprofen for pain/fever  RTC as needed or if symptoms persist.

## 2013-10-14 NOTE — Progress Notes (Signed)
HPI: Pt returns to the office today with concerns of sinus infection. Pt was recently treated for URI with Zpack that she finished Tuesday. Her chest symptoms have improved, however now she is having sinus pressure, facial pain, headache, nasal congestion, and chills. These symptoms started about 5 days ago and have not improved. She has tried PhotographerTC Allegra without any relief. She denies fatigue, ear pain, sore throat, or shortness of breath.     Review of Systems    Past Medical History  Diagnosis Date  . Allergy     allergic rhinitis seasonal  . Asthma     mild  . Dysmenorrhea   . Fx wrist 06/2003    No family history on file.  History   Social History  . Marital Status: Single    Spouse Name: N/A    Number of Children: N/A  . Years of Education: N/A   Occupational History  . Not on file.   Social History Main Topics  . Smoking status: Never Smoker   . Smokeless tobacco: Never Used  . Alcohol Use: No  . Drug Use: No  . Sexual Activity: Yes    Birth Control/ Protection: Condom   Other Topics Concern  . Not on file   Social History Narrative  . No narrative on file    No Known Allergies   Constitutional: Positive headache and chills.  Denies abrupt weight changes, fever, or fatigue.   HEENT:  Positive eye pain, pressure behind the eyes, facial pain, nasal congestion. Denies eye redness, ear pain, ringing in the ears, wax buildup, sore throat, runny nose or bloody nose. Respiratory: Positive cough. Denies difficulty breathing or shortness of breath.  Cardiovascular: Denies chest pain, chest tightness, palpitations or swelling in the hands or feet.   No other specific complaints in a complete review of systems (except as listed in HPI above).  Objective:    General: Appears his stated age, well developed, well nourished in NAD. HEENT: Head: normal shape and size, sinus tenderness noted; Eyes: sclera white, no icterus, conjunctiva pink, PERRLA and EOMs intact;  Ears: Tm's gray and intact, normal light reflex; Nose: mucosa pink and moist, septum midline  Bilateral maxillary/ethmoid tenderness; Throat/Mouth: + PND. Teeth present, mucosa pink and moist, no exudate noted, no lesions or ulcerations noted.  Neck: Neck supple, trachea midline. No massses, lumps or thyromegaly present.  Cardiovascular: Normal rate and rhythm. S1,S2 noted.  No murmur, rubs or gallops noted. No JVD or BLE edema. No carotid bruits noted. Pulmonary/Chest: Normal effort and positive vesicular breath sounds. No respiratory distress. No wheezes, rales or ronchi noted.      Assessment & Plan:   Acute bacterial sinusitis  Can use a Neti Pot which can be purchased from your local drug store. Flonase 2 sprays each nostril for 3 days and then as needed. Augmentin BID for 10 days Continue Allegra daily RTC as needed or if symptoms persist.  Eliot Popper, Jacques Earthlyourtney S, Student-NP

## 2013-10-14 NOTE — Patient Instructions (Addendum)

## 2013-10-14 NOTE — Progress Notes (Signed)
Pre-visit discussion using our clinic review tool. No additional management support is needed unless otherwise documented below in the visit note.  

## 2014-03-31 ENCOUNTER — Other Ambulatory Visit: Payer: Self-pay | Admitting: Family Medicine

## 2014-03-31 NOTE — Telephone Encounter (Signed)
done

## 2014-03-31 NOTE — Telephone Encounter (Signed)
Electronic refill request, no recent/future appts., please advise  

## 2014-03-31 NOTE — Telephone Encounter (Signed)
Please refill for 6 mo 

## 2014-06-24 ENCOUNTER — Encounter: Payer: Self-pay | Admitting: Family Medicine

## 2014-06-24 ENCOUNTER — Ambulatory Visit (INDEPENDENT_AMBULATORY_CARE_PROVIDER_SITE_OTHER): Payer: Federal, State, Local not specified - PPO | Admitting: Family Medicine

## 2014-06-24 VITALS — BP 118/70 | HR 115 | Temp 98.1°F | Wt 167.5 lb

## 2014-06-24 DIAGNOSIS — R059 Cough, unspecified: Secondary | ICD-10-CM

## 2014-06-24 DIAGNOSIS — R05 Cough: Secondary | ICD-10-CM

## 2014-06-24 MED ORDER — AMOXICILLIN-POT CLAVULANATE 875-125 MG PO TABS: 1.0000 | ORAL_TABLET | Freq: Two times a day (BID) | ORAL | Status: DC

## 2014-06-24 MED ORDER — BENZONATATE 200 MG PO CAPS: 200.0000 mg | ORAL_CAPSULE | Freq: Three times a day (TID) | ORAL | Status: DC | PRN

## 2014-06-24 NOTE — Patient Instructions (Signed)
Start augmentin today and use tessalon for the cough. Drink plenty of fluids, take tylenol as needed, and gargle with warm salt water for your throat.  This should gradually improve.  Take care.  Let us know if you have other concerns.

## 2014-06-24 NOTE — Progress Notes (Signed)
Pre visit review using our clinic review tool, if applicable. No additional management support is needed unless otherwise documented below in the visit note.  Started a few weeks ago with cough, chest congestion.  Green and yellow mucous.  Stick contacts.  Not stuffy.  No FCV, but occ feels hot.  Some wheeze. No ear pain.  No facial pain.  ST only from coughing/sputum production.    LMP was 06/01/14.  Recently married, 05/29/14.    Meds, vitals, and allergies reviewed.   ROS: See HPI.  Otherwise, noncontributory.  GEN: nad, alert and oriented HEENT: mucous membranes moist, tm w/o erythema, nasal exam w/o erythema, clear discharge noted,  OP with cobblestoning NECK: supple w/o LA CV: rrr.   PULM: ctab, no inc wob EXT: no edema SKIN: no acute rash

## 2014-06-26 DIAGNOSIS — R059 Cough, unspecified: Secondary | ICD-10-CM | POA: Insufficient documentation

## 2014-06-26 DIAGNOSIS — R05 Cough: Secondary | ICD-10-CM | POA: Insufficient documentation

## 2014-06-26 NOTE — Assessment & Plan Note (Signed)
Presumed bronchitis, nontoxic, start agumentin and fu prn.  D/w pt.  She agrees.

## 2014-08-05 ENCOUNTER — Ambulatory Visit (INDEPENDENT_AMBULATORY_CARE_PROVIDER_SITE_OTHER): Payer: Federal, State, Local not specified - PPO | Admitting: Family Medicine

## 2014-08-05 ENCOUNTER — Encounter: Payer: Self-pay | Admitting: Family Medicine

## 2014-08-05 VITALS — BP 112/78 | HR 92 | Temp 98.8°F | Wt 171.5 lb

## 2014-08-05 DIAGNOSIS — Z3201 Encounter for pregnancy test, result positive: Secondary | ICD-10-CM

## 2014-08-05 DIAGNOSIS — Z3400 Encounter for supervision of normal first pregnancy, unspecified trimester: Secondary | ICD-10-CM

## 2014-08-05 DIAGNOSIS — N912 Amenorrhea, unspecified: Secondary | ICD-10-CM

## 2014-08-05 DIAGNOSIS — J4520 Mild intermittent asthma, uncomplicated: Secondary | ICD-10-CM

## 2014-08-05 LAB — POCT URINE PREGNANCY: Preg Test, Ur: POSITIVE

## 2014-08-05 MED ORDER — PRENATAL LOW IRON 27-0.8 MG PO TABS: 1.0000 | ORAL_TABLET | Freq: Every day | ORAL | Status: DC

## 2014-08-05 NOTE — Assessment & Plan Note (Addendum)
First baby, pt and husband very excited. Reviewed hydration status, dietary choices, rest. Will refer to OBGYN to establish for prenatal care. Red flags to monitor for discussed including fever, abd pain, vaginal bleed.

## 2014-08-05 NOTE — Progress Notes (Signed)
Pre visit review using our clinic review tool, if applicable. No additional management support is needed unless otherwise documented below in the visit note. 

## 2014-08-05 NOTE — Progress Notes (Signed)
BP 112/78 mmHg  Pulse 92  Temp(Src) 98.8 F (37.1 C) (Oral)  Wt 171 lb 8 oz (77.792 kg)  LMP 07/03/2014   CC: +upreg  Subjective:    Patient ID: Bethany CowmanKristin P Perkins, female    DOB: 11/03/1990, 23 y.o.   MRN: [id number]  HPI: Bethany CowmanKristin P Perkins is a 23 y.o. female presenting on 08/05/2014 for Amenorrhea   Presents with husband today.  5 days late to period. Some lower abd cramping, some headaches. Some nausea after eating in afternoons. + breast tenderness.   No vag bleeding or morning sickness.  LMP 07/03/2014. Regular periods.  No birth control, not trying but "what happens happens".  Relevant past medical, surgical, family and social history reviewed and updated as indicated. Interim medical history since our last visit reviewed. Allergies and medications reviewed and updated.  No current outpatient prescriptions on file prior to visit.   No current facility-administered medications on file prior to visit.   Past Medical History  Diagnosis Date  . Allergy     allergic rhinitis seasonal  . Asthma     mild  . Dysmenorrhea   . Fx wrist 06/2003   History reviewed. No pertinent past surgical history.  History  Substance Use Topics  . Smoking status: Never Smoker   . Smokeless tobacco: Never Used  . Alcohol Use: No   Review of Systems Per HPI unless specifically indicated above     Objective:    BP 112/78 mmHg  Pulse 92  Temp(Src) 98.8 F (37.1 C) (Oral)  Wt 171 lb 8 oz (77.792 kg)  LMP 07/03/2014  Wt Readings from Last 3 Encounters:  08/05/14 171 lb 8 oz (77.792 kg)  06/24/14 167 lb 8 oz (75.978 kg)  10/14/13 154 lb 12.8 oz (70.217 kg)    Physical Exam  Constitutional: She appears well-developed and well-nourished. No distress.  HENT:  Mouth/Throat: Oropharynx is clear and moist. No oropharyngeal exudate.  Eyes: Conjunctivae and EOM are normal. Pupils are equal, round, and reactive to light. No scleral icterus.  Neck: Normal range of motion. Neck  supple. No thyromegaly present.  Cardiovascular: Normal rate, regular rhythm, normal heart sounds and intact distal pulses.   No murmur heard. Pulmonary/Chest: Effort normal and breath sounds normal. No respiratory distress. She has no wheezes. She has no rales.  Abdominal: Soft. Normal appearance and bowel sounds are normal. She exhibits no distension and no mass. There is no hepatosplenomegaly. There is no tenderness. There is no rigidity, no rebound, no guarding, no CVA tenderness and negative Murphy's sign.  Musculoskeletal: She exhibits no edema.  Lymphadenopathy:    She has no cervical adenopathy.  Skin: Skin is warm and dry. No rash noted.  Psychiatric: She has a normal mood and affect.  Nursing note and vitals reviewed.  Results for orders placed or performed in visit on 08/05/14  POCT urine pregnancy  Result Value Ref Range   Preg Test, Ur Positive       Assessment & Plan:   Problem List Items Addressed This Visit    Primigravida - Primary    First baby, pt and husband very excited. Reviewed hydration status, dietary choices, rest. Will refer to OBGYN to establish for prenatal care. Red flags to monitor for discussed including fever, abd pain, vaginal bleed.    Relevant Orders      Ambulatory referral to Obstetrics / Gynecology   Asthma    No recent trouble, doesn't need albuterol.     Other Visit  Diagnoses    Amenorrhea        Relevant Orders       POCT urine pregnancy (Completed)        Follow up plan: Return as needed.

## 2014-08-05 NOTE — Assessment & Plan Note (Signed)
No recent trouble, doesn't need albuterol.

## 2014-08-05 NOTE — Patient Instructions (Signed)
Congratulations! Pass by [facility]'s office for OBGYN referral. Start prenatal vitamin sent to pharmacy - or OTC equivalent.  First Trimester of Pregnancy The first trimester of pregnancy is from week 1 until the end of week 12 (months 1 through 3). A week after a sperm fertilizes an egg, the egg will implant on the wall of the uterus. This embryo will begin to develop into a baby. Genes from you and your partner are forming the baby. The female genes determine whether the baby is a boy or a girl. At 6-8 weeks, the eyes and face are formed, and the heartbeat can be seen on ultrasound. At the end of 12 weeks, all the baby's organs are formed.  Now that you are pregnant, you will want to do everything you can to have a healthy baby. Two of the most important things are to get good prenatal care and to follow your health care provider's instructions. Prenatal care is all the medical care you receive before the baby's birth. This care will help prevent, find, and treat any problems during the pregnancy and childbirth. BODY CHANGES Your body goes through many changes during pregnancy. The changes vary from woman to woman.   You may gain or lose a couple of pounds at first.  You may feel sick to your stomach (nauseous) and throw up (vomit). If the vomiting is uncontrollable, call your health care provider.  You may tire easily.  You may develop headaches that can be relieved by medicines approved by your health care provider.  You may urinate more often. Painful urination may mean you have a bladder infection.  You may develop heartburn as a result of your pregnancy.  You may develop constipation because certain hormones are causing the muscles that push waste through your intestines to slow down.  You may develop hemorrhoids or swollen, bulging veins (varicose veins).  Your breasts may begin to grow larger and become tender. Your nipples may stick out more, and the tissue that surrounds them (areola)  may become darker.  Your gums may bleed and may be sensitive to brushing and flossing.  Dark spots or blotches (chloasma, mask of pregnancy) may develop on your face. This will likely fade after the baby is born.  Your menstrual periods will stop.  You may have a loss of appetite.  You may develop cravings for certain kinds of food.  You may have changes in your emotions from day to day, such as being excited to be pregnant or being concerned that something may go wrong with the pregnancy and baby.  You may have more vivid and strange dreams.  You may have changes in your hair. These can include thickening of your hair, rapid growth, and changes in texture. Some women also have hair loss during or after pregnancy, or hair that feels dry or thin. Your hair will most likely return to normal after your baby is born. WHAT TO EXPECT AT YOUR PRENATAL VISITS During a routine prenatal visit:  You will be weighed to make sure you and the baby are growing normally.  Your blood pressure will be taken.  Your abdomen will be measured to track your baby's growth.  The fetal heartbeat will be listened to starting around week 10 or 12 of your pregnancy.  Test results from any previous visits will be discussed. Your health care provider may ask you:  How you are feeling.  If you are feeling the baby move.  If you have had any abnormal  symptoms, such as leaking fluid, bleeding, severe headaches, or abdominal cramping.  If you have any questions. Other tests that may be performed during your first trimester include:  Blood tests to find your blood type and to check for the presence of any previous infections. They will also be used to check for low iron levels (anemia) and Rh antibodies. Later in the pregnancy, blood tests for diabetes will be done along with other tests if problems develop.  Urine tests to check for infections, diabetes, or protein in the urine.  An ultrasound to confirm  the proper growth and development of the baby.  An amniocentesis to check for possible genetic problems.  Fetal screens for spina bifida and Down syndrome.  You may need other tests to make sure you and the baby are doing well. HOME CARE INSTRUCTIONS  Medicines  Follow your health care provider's instructions regarding medicine use. Specific medicines may be either safe or unsafe to take during pregnancy.  Take your prenatal vitamins as directed.  If you develop constipation, try taking a stool softener if your health care provider approves. Diet  Eat regular, well-balanced meals. Choose a variety of foods, such as meat or vegetable-based protein, fish, milk and low-fat dairy products, vegetables, fruits, and whole grain breads and cereals. Your health care provider will help you determine the amount of weight gain that is right for you.  Avoid raw meat and uncooked cheese. These carry germs that can cause birth defects in the baby.  Eating four or five small meals rather than three large meals a day may help relieve nausea and vomiting. If you start to feel nauseous, eating a few soda crackers can be helpful. Drinking liquids between meals instead of during meals also seems to help nausea and vomiting.  If you develop constipation, eat more high-fiber foods, such as fresh vegetables or fruit and whole grains. Drink enough fluids to keep your urine clear or pale yellow. Activity and Exercise  Exercise only as directed by your health care provider. Exercising will help you:  Control your weight.  Stay in shape.  Be prepared for labor and delivery.  Experiencing pain or cramping in the lower abdomen or low back is a good sign that you should stop exercising. Check with your health care provider before continuing normal exercises.  Try to avoid standing for long periods of time. Move your legs often if you must stand in one place for a long time.  Avoid heavy lifting.  Wear  low-heeled shoes, and practice good posture.  You may continue to have sex unless your health care provider directs you otherwise. Relief of Pain or Discomfort  Wear a good support bra for breast tenderness.   Take warm sitz baths to soothe any pain or discomfort caused by hemorrhoids. Use hemorrhoid cream if your health care provider approves.   Rest with your legs elevated if you have leg cramps or low back pain.  If you develop varicose veins in your legs, wear support hose. Elevate your feet for 15 minutes, 3-4 times a day. Limit salt in your diet. Prenatal Care  Schedule your prenatal visits by the twelfth week of pregnancy. They are usually scheduled monthly at first, then more often in the last 2 months before delivery.  Write down your questions. Take them to your prenatal visits.  Keep all your prenatal visits as directed by your health care provider. Safety  Wear your seat belt at all times when driving.  Make a  list of emergency phone numbers, including numbers for family, friends, the hospital, and police and fire departments. General Tips  Ask your health care provider for a referral to a local prenatal education class. Begin classes no later than at the beginning of month 6 of your pregnancy.  Ask for help if you have counseling or nutritional needs during pregnancy. Your health care provider can offer advice or refer you to specialists for help with various needs.  Do not use hot tubs, steam rooms, or saunas.  Do not douche or use tampons or scented sanitary pads.  Do not cross your legs for long periods of time.  Avoid cat litter boxes and soil used by cats. These carry germs that can cause birth defects in the baby and possibly loss of the fetus by miscarriage or stillbirth.  Avoid all smoking, herbs, alcohol, and medicines not prescribed by your health care provider. Chemicals in these affect the formation and growth of the baby.  Schedule a dentist  appointment. At home, brush your teeth with a soft toothbrush and be gentle when you floss. SEEK MEDICAL CARE IF:   You have dizziness.  You have mild pelvic cramps, pelvic pressure, or nagging pain in the abdominal area.  You have persistent nausea, vomiting, or diarrhea.  You have a bad smelling vaginal discharge.  You have pain with urination.  You notice increased swelling in your face, hands, legs, or ankles. SEEK IMMEDIATE MEDICAL CARE IF:   You have a fever.  You are leaking fluid from your vagina.  You have spotting or bleeding from your vagina.  You have severe abdominal cramping or pain.  You have rapid weight gain or loss.  You vomit blood or material that looks like coffee grounds.  You are exposed to [place] measles and have never had them.  You are exposed to fifth disease or chickenpox.  You develop a severe headache.  You have shortness of breath.  You have any kind of trauma, such as from a fall or a car accident. Document Released: 08/06/2001 Document Revised: 12/27/2013 Document Reviewed: 06/22/2013 [facility] Patient Information 2015 [place], [place]. This information is not intended to replace advice given to you by your health care provider. Make sure you discuss any questions you have with your health care provider.

## 2014-08-16 LAB — US OB COMP LESS 14 WKS

## 2014-08-29 LAB — OB RESULTS CONSOLE RPR: RPR: NONREACTIVE

## 2014-08-29 LAB — OB RESULTS CONSOLE HIV ANTIBODY (ROUTINE TESTING): HIV: NONREACTIVE

## 2014-08-29 LAB — OB RESULTS CONSOLE RUBELLA ANTIBODY, IGM: Rubella: IMMUNE

## 2014-08-29 LAB — OB RESULTS CONSOLE HEPATITIS B SURFACE ANTIGEN: HEP B S AG: NEGATIVE

## 2014-08-29 LAB — OB RESULTS CONSOLE GC/CHLAMYDIA
CHLAMYDIA, DNA PROBE: NEGATIVE
GC PROBE AMP, GENITAL: NEGATIVE

## 2014-09-02 ENCOUNTER — Encounter: Payer: Self-pay | Admitting: Family Medicine

## 2014-09-08 ENCOUNTER — Inpatient Hospital Stay (HOSPITAL_COMMUNITY)
Admission: AD | Admit: 2014-09-08 | Payer: Federal, State, Local not specified - PPO | Source: Ambulatory Visit | Admitting: Obstetrics and Gynecology

## 2015-03-14 LAB — OB RESULTS CONSOLE GBS: STREP GROUP B AG: NEGATIVE

## 2015-04-06 ENCOUNTER — Encounter (HOSPITAL_COMMUNITY): Payer: Self-pay

## 2015-04-06 ENCOUNTER — Inpatient Hospital Stay (HOSPITAL_COMMUNITY)
Admission: AD | Admit: 2015-04-06 | Discharge: 2015-04-10 | DRG: 766 | Disposition: A | Discharge status: Home / Self Care | Payer: Federal, State, Local not specified - PPO | Source: Ambulatory Visit | Attending: Obstetrics and Gynecology | Admitting: Obstetrics and Gynecology

## 2015-04-06 DIAGNOSIS — Z98891 History of uterine scar from previous surgery: Secondary | ICD-10-CM

## 2015-04-06 DIAGNOSIS — O99214 Obesity complicating childbirth: Secondary | ICD-10-CM | POA: Diagnosis present

## 2015-04-06 DIAGNOSIS — Z6830 Body mass index (BMI) 30.0-30.9, adult: Secondary | ICD-10-CM | POA: Diagnosis not present

## 2015-04-06 DIAGNOSIS — J45909 Unspecified asthma, uncomplicated: Secondary | ICD-10-CM | POA: Diagnosis present

## 2015-04-06 DIAGNOSIS — Z809 Family history of malignant neoplasm, unspecified: Secondary | ICD-10-CM

## 2015-04-06 DIAGNOSIS — O4290 Premature rupture of membranes, unspecified as to length of time between rupture and onset of labor, unspecified weeks of gestation: Secondary | ICD-10-CM | POA: Diagnosis present

## 2015-04-06 DIAGNOSIS — O9952 Diseases of the respiratory system complicating childbirth: Secondary | ICD-10-CM | POA: Diagnosis present

## 2015-04-06 DIAGNOSIS — E669 Obesity, unspecified: Secondary | ICD-10-CM | POA: Diagnosis present

## 2015-04-06 DIAGNOSIS — Z3A39 39 weeks gestation of pregnancy: Secondary | ICD-10-CM | POA: Diagnosis present

## 2015-04-06 HISTORY — DX: Anxiety disorder, unspecified: F41.9

## 2015-04-06 LAB — URINALYSIS, ROUTINE W REFLEX MICROSCOPIC
BILIRUBIN URINE: NEGATIVE
Glucose, UA: NEGATIVE mg/dL
Hgb urine dipstick: NEGATIVE
KETONES UR: NEGATIVE mg/dL
LEUKOCYTES UA: NEGATIVE
Nitrite: NEGATIVE
PH: 8 (ref 5.0–8.0)
Protein, ur: NEGATIVE mg/dL
Specific Gravity, Urine: 1.015 (ref 1.005–1.030)
Urobilinogen, UA: 0.2 mg/dL (ref 0.0–1.0)

## 2015-04-06 LAB — TYPE AND SCREEN
ABO/RH(D): O POS
Antibody Screen: NEGATIVE

## 2015-04-06 LAB — CBC
HEMATOCRIT: 36.6 % (ref 36.0–46.0)
Hemoglobin: 12.1 g/dL (ref 12.0–15.0)
MCH: 28.9 pg (ref 26.0–34.0)
MCHC: 33.1 g/dL (ref 30.0–36.0)
MCV: 87.6 fL (ref 78.0–100.0)
Platelets: 289 10*3/uL (ref 150–400)
RBC: 4.18 MIL/uL (ref 3.87–5.11)
RDW: 15.2 % (ref 11.5–15.5)
WBC: 13.5 10*3/uL — ABNORMAL HIGH (ref 4.0–10.5)

## 2015-04-06 LAB — ABO/RH: ABO/RH(D): O POS

## 2015-04-06 LAB — POCT FERN TEST: POCT FERN TEST: POSITIVE

## 2015-04-06 MED ORDER — OXYCODONE-ACETAMINOPHEN 5-325 MG PO TABS: 1.0000 | ORAL_TABLET | ORAL | Status: DC | PRN

## 2015-04-06 MED ORDER — BUTORPHANOL TARTRATE 1 MG/ML IJ SOLN: 1.0000 mg | INTRAMUSCULAR | Status: DC | PRN

## 2015-04-06 MED ORDER — OXYTOCIN 40 UNITS IN LACTATED RINGERS INFUSION - SIMPLE MED
1.0000 m[IU]/min | INTRAVENOUS | Status: DC
  Administered 2015-04-06: 1 m[IU]/min via INTRAVENOUS
  Filled 2015-04-06: qty 1000

## 2015-04-06 MED ORDER — LACTATED RINGERS IV SOLN
500.0000 mL | INTRAVENOUS | Status: DC | PRN
  Administered 2015-04-07: 500 mL via INTRAVENOUS

## 2015-04-06 MED ORDER — LIDOCAINE HCL (PF) 1 % IJ SOLN: 30.0000 mL | INTRAMUSCULAR | Status: DC | PRN

## 2015-04-06 MED ORDER — ONDANSETRON HCL 4 MG/2ML IJ SOLN: 4.0000 mg | Freq: Four times a day (QID) | INTRAMUSCULAR | Status: DC | PRN

## 2015-04-06 MED ORDER — LACTATED RINGERS IV SOLN
INTRAVENOUS | Status: DC
  Administered 2015-04-06 – 2015-04-07 (×4): via INTRAVENOUS

## 2015-04-06 MED ORDER — OXYTOCIN BOLUS FROM INFUSION: 500.0000 mL | INTRAVENOUS | Status: DC

## 2015-04-06 MED ORDER — OXYCODONE-ACETAMINOPHEN 5-325 MG PO TABS: 2.0000 | ORAL_TABLET | ORAL | Status: DC | PRN

## 2015-04-06 MED ORDER — FLEET ENEMA 7-19 GM/118ML RE ENEM: 1.0000 | ENEMA | RECTAL | Status: DC | PRN

## 2015-04-06 MED ORDER — DIPHENHYDRAMINE HCL 50 MG/ML IJ SOLN: 12.5000 mg | INTRAMUSCULAR | Status: DC | PRN

## 2015-04-06 MED ORDER — CITRIC ACID-SODIUM CITRATE 334-500 MG/5ML PO SOLN
30.0000 mL | ORAL | Status: DC | PRN
  Administered 2015-04-07: 30 mL via ORAL
  Filled 2015-04-06: qty 15

## 2015-04-06 MED ORDER — ACETAMINOPHEN 325 MG PO TABS: 650.0000 mg | ORAL_TABLET | ORAL | Status: DC | PRN

## 2015-04-06 MED ORDER — FENTANYL 2.5 MCG/ML BUPIVACAINE 1/10 % EPIDURAL INFUSION (WH - ANES)
14.0000 mL/h | INTRAMUSCULAR | Status: DC | PRN
  Administered 2015-04-07: 14 mL/h via EPIDURAL
  Filled 2015-04-06: qty 125

## 2015-04-06 MED ORDER — EPHEDRINE 5 MG/ML INJ: 10.0000 mg | INTRAVENOUS | Status: DC | PRN

## 2015-04-06 MED ORDER — TERBUTALINE SULFATE 1 MG/ML IJ SOLN: 0.2500 mg | Freq: Once | INTRAMUSCULAR | Status: DC | PRN

## 2015-04-06 MED ORDER — OXYTOCIN 40 UNITS IN LACTATED RINGERS INFUSION - SIMPLE MED: 62.5000 mL/h | INTRAVENOUS | Status: DC

## 2015-04-06 MED ORDER — PHENYLEPHRINE 40 MCG/ML (10ML) SYRINGE FOR IV PUSH (FOR BLOOD PRESSURE SUPPORT)
80.0000 ug | PREFILLED_SYRINGE | INTRAVENOUS | Status: DC | PRN
  Filled 2015-04-06: qty 20

## 2015-04-06 NOTE — MAU Note (Signed)
Pt presents complaining of a green mucousy discharge that has increased over the last day. Also thinks her water may have broke. Denies vaginal bleeding. Reports good fetal movement.

## 2015-04-06 NOTE — Progress Notes (Signed)
Called to notify of meconium staining in fluid. MD notified awareness

## 2015-04-06 NOTE — H&P (Signed)
Bethany Perkins is a 24 y.o. female presenting for leaking fluid since about 3:30 am. Did not realize what it was for a while. Has had green color all day. Maternal Medical History:  Reason for admission: Rupture of membranes.   Contractions: Onset was 13-24 hours ago.    Fetal activity: Perceived fetal activity is normal.      OB History    Gravida Para Term Preterm AB TAB SAB Ectopic Multiple Living   1              Past Medical History  Diagnosis Date  . Allergy     allergic rhinitis seasonal  . Asthma     mild  . Dysmenorrhea   . Fx wrist 06/2003   History reviewed. No pertinent past surgical history. Family History: family history includes Cancer in her maternal grandfather and paternal grandmother. Social History:  reports that she has never smoked. She has never used smokeless tobacco. She reports that she does not drink alcohol or use illicit drugs.   Prenatal Transfer Tool  Maternal Diabetes: No Genetic Screening: Normal Maternal Ultrasounds/Referrals: Normal Fetal Ultrasounds or other Referrals:  None Maternal Substance Abuse:  No Significant Maternal Medications:  None Significant Maternal Lab Results:  None Other Comments:  None  Review of Systems  Eyes: Negative for blurred vision.  Gastrointestinal: Negative for abdominal pain.  Neurological: Negative for headaches.    Dilation: 1.5 Effacement (%): 50 Station: -2 Exam by:: Sharen Hint RN Blood pressure 137/83, pulse 116, temperature 98.6 F (37 C), temperature source Oral, resp. rate 18, last menstrual period 07/03/2014. Maternal Exam:  Uterine Assessment: Contraction strength is firm.  Contraction frequency is irregular.   Abdomen: Patient reports no abdominal tenderness. Fetal presentation: vertex     Fetal Exam Fetal State Assessment: Category I - tracings are normal.     Physical Exam  Cardiovascular: Normal rate and regular rhythm.   Respiratory: Effort normal and breath  sounds normal.  GI: Soft. There is no tenderness.  Neurological: She has normal reflexes.    Cx 2/90/-2/vtx   Prenatal labs: ABO, Rh: --/--/O POS (08/11 1440) Antibody: NEG (08/11 1440) Rubella:   RPR:    HBsAg:    HIV:    GBS:     Assessment/Plan: 24 yo G1P0 @ 39 4/7 weeks with SROM and meconium staining D/W patient and husband above Will watch FHT closely Still on MAU-will D/W charge nurse again need to transfer to L&D D/W patient and husband possible pitocin if needed when on L&D   Janard Culp II,Jamice Carreno E 04/06/2015, 7:38 PM

## 2015-04-06 NOTE — Plan of Care (Signed)
Problem: Consults Goal: Birthing Suites Patient Information Press F2 to bring up selections list Outcome: Completed/Met Date Met:  04/06/15  Pt 37-[redacted] weeks EGA and Other (specify with a note) moderate meconium

## 2015-04-06 NOTE — Progress Notes (Signed)
Notified of pt ruptured membranes and positive fern. Will put in admit orders.

## 2015-04-07 ENCOUNTER — Inpatient Hospital Stay (HOSPITAL_COMMUNITY): Payer: Federal, State, Local not specified - PPO | Admitting: Anesthesiology

## 2015-04-07 ENCOUNTER — Encounter (HOSPITAL_COMMUNITY): Payer: Self-pay

## 2015-04-07 ENCOUNTER — Encounter (HOSPITAL_COMMUNITY)
Admission: AD | Disposition: A | Discharge status: Home / Self Care | Payer: Self-pay | Source: Ambulatory Visit | Attending: Obstetrics and Gynecology

## 2015-04-07 DIAGNOSIS — Z98891 History of uterine scar from previous surgery: Secondary | ICD-10-CM

## 2015-04-07 LAB — RPR: RPR: NONREACTIVE

## 2015-04-07 LAB — HIV ANTIBODY (ROUTINE TESTING W REFLEX): HIV Screen 4th Generation wRfx: NONREACTIVE

## 2015-04-07 SURGERY — Surgical Case
Anesthesia: Epidural

## 2015-04-07 MED ORDER — IBUPROFEN 600 MG PO TABS: 600.0000 mg | ORAL_TABLET | Freq: Four times a day (QID) | ORAL | Status: DC | PRN

## 2015-04-07 MED ORDER — SENNOSIDES-DOCUSATE SODIUM 8.6-50 MG PO TABS
2.0000 | ORAL_TABLET | ORAL | Status: DC
  Administered 2015-04-07 – 2015-04-09 (×3): 2 via ORAL
  Filled 2015-04-07 (×3): qty 2

## 2015-04-07 MED ORDER — NALBUPHINE HCL 10 MG/ML IJ SOLN: 5.0000 mg | Freq: Once | INTRAMUSCULAR | Status: DC | PRN

## 2015-04-07 MED ORDER — MEASLES, MUMPS & RUBELLA VAC ~~LOC~~ INJ
0.5000 mL | INJECTION | Freq: Once | SUBCUTANEOUS | Status: DC
  Filled 2015-04-07: qty 0.5

## 2015-04-07 MED ORDER — NALBUPHINE HCL 10 MG/ML IJ SOLN: 5.0000 mg | INTRAMUSCULAR | Status: DC | PRN

## 2015-04-07 MED ORDER — CEFAZOLIN SODIUM-DEXTROSE 2-3 GM-% IV SOLR
2.0000 g | Freq: Three times a day (TID) | INTRAVENOUS | Status: DC
  Administered 2015-04-07: 2 g via INTRAVENOUS
  Filled 2015-04-07 (×2): qty 50

## 2015-04-07 MED ORDER — DIPHENHYDRAMINE HCL 25 MG PO CAPS: 25.0000 mg | ORAL_CAPSULE | ORAL | Status: DC | PRN

## 2015-04-07 MED ORDER — CEFAZOLIN SODIUM-DEXTROSE 2-3 GM-% IV SOLR
INTRAVENOUS | Status: AC
  Filled 2015-04-07: qty 50

## 2015-04-07 MED ORDER — DIPHENHYDRAMINE HCL 50 MG/ML IJ SOLN: 12.5000 mg | INTRAMUSCULAR | Status: DC | PRN

## 2015-04-07 MED ORDER — SODIUM BICARBONATE 8.4 % IV SOLN
INTRAVENOUS | Status: DC | PRN
  Administered 2015-04-07 (×5): 5 mL via EPIDURAL

## 2015-04-07 MED ORDER — FENTANYL CITRATE (PF) 100 MCG/2ML IJ SOLN: 25.0000 ug | INTRAMUSCULAR | Status: DC | PRN

## 2015-04-07 MED ORDER — WITCH HAZEL-GLYCERIN EX PADS: 1.0000 "application " | MEDICATED_PAD | CUTANEOUS | Status: DC | PRN

## 2015-04-07 MED ORDER — IBUPROFEN 600 MG PO TABS
600.0000 mg | ORAL_TABLET | Freq: Four times a day (QID) | ORAL | Status: DC
Start: 2015-04-07 — End: 2015-04-10
  Administered 2015-04-07 – 2015-04-10 (×12): 600 mg via ORAL
  Filled 2015-04-07 (×12): qty 1

## 2015-04-07 MED ORDER — DEXTROSE IN LACTATED RINGERS 5 % IV SOLN: INTRAVENOUS | Status: DC

## 2015-04-07 MED ORDER — DIPHENHYDRAMINE HCL 25 MG PO CAPS: 25.0000 mg | ORAL_CAPSULE | Freq: Four times a day (QID) | ORAL | Status: DC | PRN

## 2015-04-07 MED ORDER — PRENATAL MULTIVITAMIN CH
1.0000 | ORAL_TABLET | Freq: Every day | ORAL | Status: DC
Start: 2015-04-07 — End: 2015-04-10
  Administered 2015-04-08 – 2015-04-10 (×3): 1 via ORAL
  Filled 2015-04-07 (×3): qty 1

## 2015-04-07 MED ORDER — SODIUM CHLORIDE 0.9 % IJ SOLN: 3.0000 mL | INTRAMUSCULAR | Status: DC | PRN

## 2015-04-07 MED ORDER — LIDOCAINE HCL (PF) 1 % IJ SOLN
INTRAMUSCULAR | Status: DC | PRN
  Administered 2015-04-07 (×2): 4 mL

## 2015-04-07 MED ORDER — OXYTOCIN 10 UNIT/ML IJ SOLN
40.0000 [IU] | INTRAVENOUS | Status: DC | PRN
  Administered 2015-04-07: 40 [IU] via INTRAVENOUS

## 2015-04-07 MED ORDER — SIMETHICONE 80 MG PO CHEW
80.0000 mg | CHEWABLE_TABLET | ORAL | Status: DC
  Administered 2015-04-07 – 2015-04-09 (×3): 80 mg via ORAL
  Filled 2015-04-07 (×3): qty 1

## 2015-04-07 MED ORDER — OXYCODONE-ACETAMINOPHEN 5-325 MG PO TABS
1.0000 | ORAL_TABLET | ORAL | Status: DC | PRN
  Administered 2015-04-08 – 2015-04-10 (×6): 1 via ORAL
  Filled 2015-04-07 (×6): qty 1

## 2015-04-07 MED ORDER — NALOXONE HCL 1 MG/ML IJ SOLN
1.0000 ug/kg/h | INTRAMUSCULAR | Status: DC | PRN
  Filled 2015-04-07: qty 2

## 2015-04-07 MED ORDER — MENTHOL 3 MG MT LOZG: 1.0000 | LOZENGE | OROMUCOSAL | Status: DC | PRN

## 2015-04-07 MED ORDER — OXYTOCIN 40 UNITS IN LACTATED RINGERS INFUSION - SIMPLE MED: 62.5000 mL/h | INTRAVENOUS | Status: AC

## 2015-04-07 MED ORDER — MEPERIDINE HCL 25 MG/ML IJ SOLN: 6.2500 mg | INTRAMUSCULAR | Status: DC | PRN

## 2015-04-07 MED ORDER — LANOLIN HYDROUS EX OINT: 1.0000 "application " | TOPICAL_OINTMENT | CUTANEOUS | Status: DC | PRN

## 2015-04-07 MED ORDER — ONDANSETRON HCL 4 MG/2ML IJ SOLN: 4.0000 mg | Freq: Three times a day (TID) | INTRAMUSCULAR | Status: DC | PRN

## 2015-04-07 MED ORDER — MORPHINE SULFATE 0.5 MG/ML IJ SOLN
INTRAMUSCULAR | Status: AC
  Filled 2015-04-07: qty 100

## 2015-04-07 MED ORDER — SIMETHICONE 80 MG PO CHEW
80.0000 mg | CHEWABLE_TABLET | Freq: Three times a day (TID) | ORAL | Status: DC
  Administered 2015-04-08 – 2015-04-10 (×6): 80 mg via ORAL
  Filled 2015-04-07 (×6): qty 1

## 2015-04-07 MED ORDER — ZOLPIDEM TARTRATE 5 MG PO TABS: 5.0000 mg | ORAL_TABLET | Freq: Every evening | ORAL | Status: DC | PRN

## 2015-04-07 MED ORDER — ONDANSETRON HCL 4 MG/2ML IJ SOLN
INTRAMUSCULAR | Status: AC
  Filled 2015-04-07: qty 2

## 2015-04-07 MED ORDER — SIMETHICONE 80 MG PO CHEW: 80.0000 mg | CHEWABLE_TABLET | ORAL | Status: DC | PRN

## 2015-04-07 MED ORDER — LIDOCAINE-EPINEPHRINE (PF) 2 %-1:200000 IJ SOLN
INTRAMUSCULAR | Status: AC
  Filled 2015-04-07: qty 20

## 2015-04-07 MED ORDER — DIBUCAINE 1 % RE OINT: 1.0000 "application " | TOPICAL_OINTMENT | RECTAL | Status: DC | PRN

## 2015-04-07 MED ORDER — MEDROXYPROGESTERONE ACETATE 150 MG/ML IM SUSP: 150.0000 mg | INTRAMUSCULAR | Status: DC | PRN

## 2015-04-07 MED ORDER — KETOROLAC TROMETHAMINE 30 MG/ML IJ SOLN
30.0000 mg | Freq: Four times a day (QID) | INTRAMUSCULAR | Status: AC | PRN
  Administered 2015-04-07: 30 mg via INTRAMUSCULAR

## 2015-04-07 MED ORDER — SCOPOLAMINE 1 MG/3DAYS TD PT72
MEDICATED_PATCH | TRANSDERMAL | Status: AC
  Filled 2015-04-07: qty 1

## 2015-04-07 MED ORDER — KETOROLAC TROMETHAMINE 30 MG/ML IJ SOLN
INTRAMUSCULAR | Status: AC
  Filled 2015-04-07: qty 1

## 2015-04-07 MED ORDER — ACETAMINOPHEN 325 MG PO TABS: 650.0000 mg | ORAL_TABLET | ORAL | Status: DC | PRN

## 2015-04-07 MED ORDER — SCOPOLAMINE 1 MG/3DAYS TD PT72
MEDICATED_PATCH | TRANSDERMAL | Status: DC | PRN
  Administered 2015-04-07: 1 via TRANSDERMAL

## 2015-04-07 MED ORDER — LACTATED RINGERS IV SOLN
INTRAVENOUS | Status: DC | PRN
  Administered 2015-04-07: 09:00:00 via INTRAVENOUS

## 2015-04-07 MED ORDER — KETOROLAC TROMETHAMINE 30 MG/ML IJ SOLN: 30.0000 mg | Freq: Four times a day (QID) | INTRAMUSCULAR | Status: AC | PRN

## 2015-04-07 MED ORDER — TETANUS-DIPHTH-ACELL PERTUSSIS 5-2.5-18.5 LF-MCG/0.5 IM SUSP
0.5000 mL | Freq: Once | INTRAMUSCULAR | Status: DC
Start: 2015-04-08 — End: 2015-04-08

## 2015-04-07 MED ORDER — OXYTOCIN 10 UNIT/ML IJ SOLN
INTRAMUSCULAR | Status: AC
  Filled 2015-04-07: qty 4

## 2015-04-07 MED ORDER — MORPHINE SULFATE (PF) 0.5 MG/ML IJ SOLN
INTRAMUSCULAR | Status: DC | PRN
  Administered 2015-04-07: 3 mg via EPIDURAL

## 2015-04-07 MED ORDER — OXYCODONE-ACETAMINOPHEN 5-325 MG PO TABS: 2.0000 | ORAL_TABLET | ORAL | Status: DC | PRN

## 2015-04-07 MED ORDER — ONDANSETRON HCL 4 MG/2ML IJ SOLN
INTRAMUSCULAR | Status: DC | PRN
  Administered 2015-04-07: 4 mg via INTRAVENOUS

## 2015-04-07 MED ORDER — SODIUM BICARBONATE 8.4 % IV SOLN
INTRAVENOUS | Status: AC
  Filled 2015-04-07: qty 50

## 2015-04-07 MED ORDER — NALOXONE HCL 0.4 MG/ML IJ SOLN: 0.4000 mg | INTRAMUSCULAR | Status: DC | PRN

## 2015-04-07 SURGICAL SUPPLY — 31 items
BULB HALOGEN 150 24V (BULBS & LAMPS) ×3 IMPLANT
CLAMP CORD UMBIL (MISCELLANEOUS) IMPLANT
CLOTH BEACON ORANGE TIMEOUT ST (SAFETY) ×3 IMPLANT
DRAPE SHEET LG 3/4 BI-LAMINATE (DRAPES) IMPLANT
DRSG OPSITE POSTOP 4X10 (GAUZE/BANDAGES/DRESSINGS) ×3 IMPLANT
DURAPREP 26ML APPLICATOR (WOUND CARE) ×3 IMPLANT
ELECT REM PT RETURN 9FT ADLT (ELECTROSURGICAL) ×3
ELECTRODE REM PT RTRN 9FT ADLT (ELECTROSURGICAL) ×1 IMPLANT
EXTRACTOR VACUUM M CUP 4 TUBE (SUCTIONS) IMPLANT
EXTRACTOR VACUUM M CUP 4' TUBE (SUCTIONS)
GLOVE BIO SURGEON STRL SZ 6.5 (GLOVE) ×2 IMPLANT
GLOVE BIO SURGEONS STRL SZ 6.5 (GLOVE) ×1
GLOVE BIOGEL PI IND STRL 7.0 (GLOVE) ×1 IMPLANT
GLOVE BIOGEL PI INDICATOR 7.0 (GLOVE) ×2
GOWN STRL REUS W/TWL LRG LVL3 (GOWN DISPOSABLE) ×6 IMPLANT
KIT ABG SYR 3ML LUER SLIP (SYRINGE) IMPLANT
LIQUID BAND (GAUZE/BANDAGES/DRESSINGS) ×3 IMPLANT
NDL HYPO 25X5/8 SAFETYGLIDE (NEEDLE) IMPLANT
NEEDLE HYPO 25X5/8 SAFETYGLIDE (NEEDLE) IMPLANT
NS IRRIG 1000ML POUR BTL (IV SOLUTION) ×3 IMPLANT
PACK C SECTION WH (CUSTOM PROCEDURE TRAY) ×3 IMPLANT
PAD OB MATERNITY 4.3X12.25 (PERSONAL CARE ITEMS) ×3 IMPLANT
SUT CHROMIC 0 CT 802H (SUTURE) IMPLANT
SUT CHROMIC 0 CTX 36 (SUTURE) ×9 IMPLANT
SUT MON AB-0 CT1 36 (SUTURE) ×3 IMPLANT
SUT PDS AB 0 CTX 60 (SUTURE) ×3 IMPLANT
SUT PLAIN 0 NONE (SUTURE) IMPLANT
SUT VIC AB 4-0 KS 27 (SUTURE) IMPLANT
SYR BULB 3OZ (MISCELLANEOUS) ×3 IMPLANT
TOWEL OR 17X24 6PK STRL BLUE (TOWEL DISPOSABLE) ×3 IMPLANT
TRAY FOLEY CATH SILVER 14FR (SET/KITS/TRAYS/PACK) IMPLANT

## 2015-04-07 NOTE — Addendum Note (Signed)
Addendum  created 04/07/15 1721 by Algis Greenhouse, CRNA   Modules edited: Notes Section   Notes Section:  File: [id number]

## 2015-04-07 NOTE — Anesthesia Postprocedure Evaluation (Signed)
  Anesthesia Post Note  Patient: Bethany Perkins  Procedure(s) Performed: Procedure(s) (LRB): CESAREAN SECTION (N/A)  Anesthesia type: Epidural  Patient location: Mother/Baby  Post pain: Pain level controlled  Post assessment: Post-op Vital signs reviewed  Last Vitals:  Filed Vitals:   04/07/15 1649  BP: 112/66  Pulse: 95  Temp:   Resp:     Post vital signs: Reviewed  Level of consciousness:alert  Complications: No apparent anesthesia complications

## 2015-04-07 NOTE — Progress Notes (Signed)
Cx 3/90/-2 FHT cat one UCs q2-4 min FSE and IUPC placed Fluid clear

## 2015-04-07 NOTE — Op Note (Signed)
Cesarean Section Procedure Note   Bethany Perkins  04/06/2015 - 04/07/2015  Indications: arrest of dilation   Pre-operative Diagnosis: Primary Cesarean Section for Failure to Progress.   Post-operative Diagnosis:same  Surgeon: Moishe Spice) and Role:    * Zelphia Cairo, MD - Primary   Assistants: none  Anesthesia: epidural   Procedure Details:  The patient was seen in the Holding Room. The risks, benefits, complications, treatment options, and expected outcomes were discussed with the patient. The patient concurred with the proposed plan, giving informed consent. identified as Bethany Perkins and the procedure verified as C-Section Delivery. A Time Out was held and the above information confirmed.  After induction of anesthesia, the patient was draped and prepped in the usual sterile manner. A transverse was made and carried down through the subcutaneous tissue to the fascia. Fascial incision was made and extended transversely. The fascia was separated from the underlying rectus tissue superiorly and inferiorly. The peritoneum was identified and entered. Peritoneal incision was extended longitudinally. The utero-vesical peritoneal reflection was incised transversely and the bladder flap was bluntly freed from the lower uterine segment. A low transverse uterine incision was made. Delivered from cephalic presentation was a vigorous female infant with Apgar scores of 9 at one minute and 9 at five minutes. Cord ph was not sent the umbilical cord was clamped and cut cord blood was obtained for evaluation. The placenta was removed Intact and appeared normal. The uterine outline, tubes and ovaries appeared normal}. The uterine incision was closed with running locked sutures of 0chromic gut.   Hemostasis was observed. Lavage was carried out until clear. Peritoneum closed with monocryl.  The fascia was then reapproximated with running sutures of 0PDS. The skin was closed with 4-0Vicryl.    Instrument, sponge, and needle counts were correct prior the abdominal closure and were correct at the conclusion of the case.     Estimated Blood Loss: 700cc  Urine Output: clear  Specimens:placenta for disposal   Complications: no complications  Disposition: PACU - hemodynamically stable.   Maternal Condition: stable   Baby condition / location:  Couplet care / Skin to Skin  Attending Attestation: I was present and scrubbed for the entire procedure.   Signed: Surgeon(s): Zelphia Cairo, MD

## 2015-04-07 NOTE — Progress Notes (Signed)
Pt in adequate labor > 2 hours and comfortable w/ epidural  FHT reassuring, cat 1 Toco Q1-2 Cvx 3/90/-2, vtx  A/P:  Arrest of dilation rec primary c-section, r/b/a discussed, questions answred, informed consent Ancef ordered.

## 2015-04-07 NOTE — Lactation Note (Signed)
This note was copied from the chart of Bethany Perkins. Lactation Consultation Note  Patient Name: Bethany Richell Corker [id number] Date: 04/07/2015 Reason for consult: Initial assessment Called to assist Mom with latch. Mom has flat nipples but compressible. Demonstrated hand expression and used hand pump to pre-pump, nipple/aerola softened well. Demonstrated to parents how to use breast compression (nipple sandwich) to latch baby. Took few attempts in laid back position for baby to sustain the latch, but then he demonstrated a good suckling pattern. Advised Mom to use hand pump to pre-pump with feedings. Continue to BF with feeding ques, basic teaching reviewed with parents. Lactation brochure left for review, advised of OP services and support group. Encouraged to call for assist as needed.   Maternal Data Has patient been taught Hand Expression?: Yes Does the patient have breastfeeding experience prior to this delivery?: No  Feeding Feeding Type: Breast Fed Length of feed: 15 min  LATCH Score/Interventions Latch: Repeated attempts needed to sustain latch, nipple held in mouth throughout feeding, stimulation needed to elicit sucking reflex.  Audible Swallowing: A few with stimulation  Type of Nipple: Flat Intervention(s): Hand pump  Comfort (Breast/Nipple): Soft / non-tender     Hold (Positioning): Assistance needed to correctly position infant at breast and maintain latch. Intervention(s): Breastfeeding basics reviewed;Support Pillows;Position options;Skin to skin  LATCH Score: 6  Lactation Tools Discussed/Used Tools: Pump Breast pump type: Manual WIC Program: No   Consult Status Consult Status: Follow-up Date: 04/08/15 Follow-up type: In-patient    Alfred Levins 04/07/2015, 4:34 PM

## 2015-04-07 NOTE — Anesthesia Procedure Notes (Signed)
Epidural Patient location during procedure: OB  Staffing Anesthesiologist: Nakota Ackert Performed by: anesthesiologist   Preanesthetic Checklist Completed: patient identified, site marked, surgical consent, pre-op evaluation, timeout performed, IV checked, risks and benefits discussed and monitors and equipment checked  Epidural Patient position: sitting Prep: site prepped and draped and DuraPrep Patient monitoring: continuous pulse ox and blood pressure Approach: midline Location: L3-L4 Injection technique: LOR saline  Needle:  Needle type: Tuohy  Needle gauge: 17 G Needle length: 9 cm and 9 Needle insertion depth: 5 cm cm Catheter type: closed end flexible Catheter size: 19 Gauge Catheter at skin depth: 10 cm Test dose: negative  Assessment Events: blood not aspirated, injection not painful, no injection resistance, negative IV test and no paresthesia  Additional Notes Patient identified. Risks/Benefits/Options discussed with patient including but not limited to bleeding, infection, nerve damage, paralysis, failed block, incomplete pain control, headache, blood pressure changes, nausea, vomiting, reactions to medication both or allergic, itching and postpartum back pain. Confirmed with bedside nurse the patient's most recent platelet count. Confirmed with patient that they are not currently taking any anticoagulation, have any bleeding history or any family history of bleeding disorders. Patient expressed understanding and wished to proceed. All questions were answered. Sterile technique was used throughout the entire procedure. Please see nursing notes for vital signs. Test dose was given through epidural catheter and negative prior to continuing to dose epidural or start infusion. Warning signs of high block given to the patient including shortness of breath, tingling/numbness in hands, complete motor block, or any concerning symptoms with instructions to call for help. Patient was  given instructions on fall risk and not to get out of bed. All questions and concerns addressed with instructions to call with any issues or inadequate analgesia.      

## 2015-04-07 NOTE — Anesthesia Preprocedure Evaluation (Addendum)
Anesthesia Evaluation  Patient identified by MRN, date of birth, ID band Patient awake    Reviewed: Allergy & Precautions, NPO status , Patient's Chart, lab work & pertinent test results  History of Anesthesia Complications Negative for: history of anesthetic complications  Airway Mallampati: II  TM Distance: >3 FB Neck ROM: Full    Dental no notable dental hx. (+) Dental Advisory Given   Pulmonary asthma ,  breath sounds clear to auscultation  Pulmonary exam normal       Cardiovascular negative cardio ROS Normal cardiovascular examRhythm:Regular Rate:Normal     Neuro/Psych PSYCHIATRIC DISORDERS Anxiety negative neurological ROS     GI/Hepatic negative GI ROS, Neg liver ROS,   Endo/Other  obesity  Renal/GU negative Renal ROS  negative genitourinary   Musculoskeletal negative musculoskeletal ROS (+)   Abdominal   Peds negative pediatric ROS (+)  Hematology negative hematology ROS (+)   Anesthesia Other Findings   Reproductive/Obstetrics (+) Pregnancy                             Anesthesia Physical Anesthesia Plan  ASA: II and emergent  Anesthesia Plan: Epidural   Post-op Pain Management:    Induction:   Airway Management Planned:   Additional Equipment:   Intra-op Plan:   Post-operative Plan:   Informed Consent: I have reviewed the patients History and Physical, chart, labs and discussed the procedure including the risks, benefits and alternatives for the proposed anesthesia with the patient or authorized representative who has indicated his/her understanding and acceptance.   Dental advisory given  Plan Discussed with: CRNA, Anesthesiologist and Surgeon  Anesthesia Plan Comments: (Patient for C/Sectio for failure to progress. Will use epidural for C/Section. M. Malen Gauze, MD)       Anesthesia Quick Evaluation

## 2015-04-07 NOTE — Transfer of Care (Signed)
Immediate Anesthesia Transfer of Care Note  Patient: Bethany Perkins  Procedure(s) Performed: Procedure(s): CESAREAN SECTION (N/A)  Patient Location: PACU  Anesthesia Type:Epidural  Level of Consciousness: awake, alert  and oriented  Airway & Oxygen Therapy: Patient Spontanous Breathing  Post-op Assessment: Report given to RN  Post vital signs: Reviewed  Last Vitals:  Filed Vitals:   04/07/15 0801  BP: 133/77  Pulse: 100  Temp:   Resp:     Complications: No apparent anesthesia complications

## 2015-04-07 NOTE — Anesthesia Postprocedure Evaluation (Signed)
  Anesthesia Post-op Note  Patient: Bethany Perkins  Procedure(s) Performed: Procedure(s): CESAREAN SECTION (N/A)  Patient Location: PACU  Anesthesia Type:Epidural  Level of Consciousness: awake, alert  and oriented  Airway and Oxygen Therapy: Patient Spontanous Breathing  Post-op Pain: none  Post-op Assessment: Post-op Vital signs reviewed, Patient's Cardiovascular Status Stable, Respiratory Function Stable, Patent Airway, No signs of Nausea or vomiting, Pain level controlled, No headache, No backache and Spinal receding LLE Motor Response: No movement due to regional block LLE Sensation: No sensation (absent) RLE Motor Response: No movement due to regional block RLE Sensation: No sensation (absent)      Post-op Vital Signs: Reviewed and stable  Last Vitals:  Filed Vitals:   04/07/15 1045  BP: 98/53  Pulse: 71  Temp:   Resp: 16    Complications: No apparent anesthesia complications

## 2015-04-08 LAB — CBC
HCT: 28.9 % — ABNORMAL LOW (ref 36.0–46.0)
Hemoglobin: 9.3 g/dL — ABNORMAL LOW (ref 12.0–15.0)
MCH: 28.4 pg (ref 26.0–34.0)
MCHC: 32.2 g/dL (ref 30.0–36.0)
MCV: 88.1 fL (ref 78.0–100.0)
PLATELETS: 219 10*3/uL (ref 150–400)
RBC: 3.28 MIL/uL — AB (ref 3.87–5.11)
RDW: 15.4 % (ref 11.5–15.5)
WBC: 11.3 10*3/uL — ABNORMAL HIGH (ref 4.0–10.5)

## 2015-04-08 LAB — BIRTH TISSUE RECOVERY COLLECTION (PLACENTA DONATION)

## 2015-04-08 NOTE — Lactation Note (Signed)
This note was copied from the chart of Bethany Perkins. Lactation Consultation Note; Mom and Dad resting, baby just back from circ and is asleep in bassinet. Mom reports she is having some trouble on the left breast- nipple is a little flatter than right RN gave her NS during the night and mom reports he is doing better with that Reports she can hand express Colostrum. Encouraged to call for assist when baby wakes for next feeding so we can assist with latch. No questions at present  Patient Name: Bethany Perkins [id number] Date: 04/08/2015 Reason for consult: Follow-up assessment   Maternal Data Formula Feeding for Exclusion: No Does the patient have breastfeeding experience prior to this delivery?: No  Feeding   LATCH Score/Interventions                      Lactation Tools Discussed/Used     Consult Status Consult Status: Follow-up Date: 04/08/15 Follow-up type: In-patient    Pamelia Hoit 04/08/2015, 10:01 AM

## 2015-04-08 NOTE — Progress Notes (Signed)
Subjective: Postpartum Day 1: Cesarean Delivery Patient reports tolerating PO and no problems voiding.    Objective: Vital signs in last 24 hours: Temp:  [97.5 F (36.4 C)-99.1 F (37.3 C)] 99 F (37.2 C) (08/13 0000) Pulse Rate:  [69-109] 109 (08/13 0430) Resp:  [12-20] 18 (08/13 0430) BP: (87-115)/(44-74) 108/61 mmHg (08/13 0430) SpO2:  [96 %-100 %] 97 % (08/13 0430)  Physical Exam:  General: alert and cooperative Lochia: appropriate Uterine Fundus: firm Incision: no significant drainage DVT Evaluation: No evidence of DVT seen on physical exam.   Recent Labs  04/06/15 1440 04/08/15 0530  HGB 12.1 9.3*  HCT 36.6 28.9*    Assessment/Plan: Status post Cesarean section. Doing well postoperatively.  Continue current care Desires circ, will schedule.  Bethany Perkins 04/08/2015, 8:12 AM

## 2015-04-09 NOTE — Progress Notes (Addendum)
Post Partum Day 2 Subjective: No complaints  Objective: Blood pressure 111/62, pulse 88, temperature 98.4 F (36.9 C), temperature source Oral, resp. rate 16, height  (1.727 m), weight 199 lb (90.266 kg), last menstrual period 07/03/2014, SpO2 97 %, unknown if currently breastfeeding.  Physical Exam:  General: alert and cooperative Lochia: appropriate Uterine Fundus: firm Incision: c/d/i DVT Evaluation: No evidence of DVT seen on physical exam.   Recent Labs  04/06/15 1440 04/08/15 0530  HGB 12.1 9.3*  HCT 36.6 28.9*    Assessment/Plan: Continue present care   LOS: 3 days   Gerson Fauth 04/09/2015, 7:50 AM

## 2015-04-10 ENCOUNTER — Encounter (HOSPITAL_COMMUNITY): Payer: Self-pay | Admitting: Obstetrics and Gynecology

## 2015-04-10 ENCOUNTER — Ambulatory Visit: Payer: Self-pay

## 2015-04-10 MED ORDER — OXYCODONE-ACETAMINOPHEN 5-325 MG PO TABS: 1.0000 | ORAL_TABLET | ORAL | Status: DC | PRN

## 2015-04-10 MED ORDER — IBUPROFEN 600 MG PO TABS: 600.0000 mg | ORAL_TABLET | Freq: Four times a day (QID) | ORAL | Status: DC

## 2015-04-10 NOTE — Discharge Summary (Signed)
Obstetric Discharge Summary Reason for Admission: rupture of membranes Prenatal Procedures: ultrasound Intrapartum Procedures: cesarean: low cervical, transverse Postpartum Procedures: none Complications-Operative and Postpartum: none HEMOGLOBIN  Date Value Ref Range Status  04/08/2015 9.3* 12.0 - 15.0 g/dL Final    Comment:    REPEATED TO VERIFY DELTA CHECK NOTED    HCT  Date Value Ref Range Status  04/08/2015 28.9* 36.0 - 46.0 % Final    Physical Exam:  General: alert and cooperative Lochia: appropriate Uterine Fundus: firm Incision: healing well DVT Evaluation: No evidence of DVT seen on physical exam. Negative Homan's sign. No cords or calf tenderness. No significant calf/ankle edema.  Discharge Diagnoses: Term Pregnancy-delivered  Discharge Information: Date: 04/10/2015 Activity: pelvic rest Diet: routine Medications: PNV, Ibuprofen and Percocet Condition: stable Instructions: refer to practice specific booklet Discharge to: home   Newborn Data: Live born female  Birth Weight: 7 lb 9 oz (3430 g) APGAR: 9, 9  Home with mother.  CURTIS,CAROL G 04/10/2015, 7:58 AM

## 2015-04-10 NOTE — Clinical Social Work Maternal (Signed)
CLINICAL SOCIAL WORK MATERNAL/CHILD NOTE  Patient Details  Name: Bethany Perkins MRN: [id number] Date of Birth: 04/20/1991  Date:  July 10, 2015  Clinical Social Worker Initiating Note:  Loleta Books, LCSW Date/ Time Initiated:  04/10/15/1030     Child's Name:  Bethany Perkins   Legal Guardian:  Belenda Cruise and Danielle Rankin  Need for Interpreter:  None   Date of Referral:  09-07-14     Reason for Referral:  History of anxiety  Referral Source:  [facility]   Address:  [address] [place], [place] [address]  Phone number:  [phone]   Household Members:  Spouse   Natural Supports (not living in the home):  Immediate Family, Extended Family   Professional Supports: None   Employment: [name] stated that she is employed by her mother, and discussed flexibility in her work schedule postpartum.  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  Prior to meeting with [name], CSW completed chart review. Per chart review, remote history of anxiety and depression since prior to 2011.  CSW spoke with RN on 8/15, who reported that there have been no concerns, and [name] presents as coping well with transition postpartum.  Upon arrival to [name]'s room, [name] was noted to be in a pleasant mood and displayed a full range in affect. She expressed appreciation for the visit and presented as easily engaged.    [name] did not present with any mental  healthy symptoms, no anxious cognitions noted. When infant began to cry, [name] presented as calm and comfortable, no distress noted as she soothed him.  [name] expressed feelings of happiness and excitement secondary to her anticipated discharge today since she has been at the hospital for numerous days. [name] reflected upon her long labor which eventually led to a C-section. She discussed that she feels "okay" with her C-section since she saw it as a "relief" since she no longer had to be in labor and she knew that it was for the best interest of the infant.  [name] shared that she feels comfortable with her recovery postpartum as she cares for the infant since the FOB has time off from work and she has immediate family that lives within 5 minutes from their home.  [name] shared that she will also be able to slowly transition back to work since she works from her mother.  CSW noted no acute psychosocial stressors that may impact her transition postpartum.  [name] confirmed remote history of anxiety in middle school and high school. She denied any symptoms during the pregnancy, and described normative range in thoughts and feelings as she has been engaging in preparations to transition roles in life. [name] and FOB shared that their mothers both had postpartum depression, and they have an increased awareness of need to monitor due to the family history.  [name] and FOB presented as attentive and engaged as CSW provided education on  perinatal mood and anxiety disorders. [name] and FOB reported intention to contact the [name]'s OB if they note onset of symptoms.  [name] and FOB denied questions, concerns, or needs at this time. They again expressed appreciation for the visit, and acknowledged ongoing CSW availability during admission if needs arise.   CSW Plan/Description:   1)Patient/Family Education: Perinatal mood and anxiety disorders 2)No Further Intervention Required/No Barriers to Discharge    Kelby Fam 2014-12-14,  11:16 AM

## 2015-04-10 NOTE — Lactation Note (Signed)
This note was copied from the chart of Boy Ruberta Holck. Lactation Consultation Note: Infant is at 9 % weight loss. Mother has been using a #16 nipple shield. Mothers breast are very firm and full. Her nipple is very swollen and flat. Pre pumped mothers rt breast for 2-3 mins. Obtained 10 ml of ebm. Attempt to apply #16 mm nipple shield. Unable to get on deep enough. Mother fit with a #20 mm . Infant latched on the shaft of the shield. Parents were taught to adjust infants lower jaw for wider gape and roll top lip upward. Mother latched infant on the alternate breast. IMother taught breast compression. Infant sustained latch for 25 mins. Parents states that this was his best feeding so far. Parents taught to listen and observed for swallows. Infant was given 10 ml with a curved tip syringe. Mother was given a written plan . Advised to continue to post pump with electric pump for 15-20 mins.  Mother has own electric pump at home. Advised parents to continue to supplement with each feeding until milk volume increases. Mother advised in using curved tip with finger or medicine cup. Infant has an appt on Wednesday with Peds. appt on Thursday August 18 with [facility] services. Parents very receptive to plan. Discussed cue base feeding, cluster feeding , frequent STS. Advised to feed infant 8-12 .   Patient Name: Boy Shulamis Wenberg [id number] Date: 04/10/2015 Reason for consult: Follow-up assessment   Maternal Data    Feeding Feeding Type: Breast Fed Length of feed: 15 min  LATCH Score/Interventions Latch: Grasps breast easily, tongue down, lips flanged, rhythmical sucking. Intervention(s): Teach feeding cues Intervention(s): Adjust position;Assist with latch;Breast compression  Audible Swallowing: Spontaneous and intermittent Intervention(s): Skin to skin Intervention(s): Alternate breast massage  Type of Nipple: Flat Intervention(s): Reverse pressure;Hand pump  Comfort (Breast/Nipple): Filling,  red/small blisters or bruises, mild/mod discomfort  Problem noted: Filling  Hold (Positioning): Assistance needed to correctly position infant at breast and maintain latch.  LATCH Score: 7  Lactation Tools Discussed/Used Tools: Nipple Shields Nipple shield size: 20   Consult Status Consult Status: Follow-up Date: 04/10/15 Follow-up type: In-patient    Stevan Born [facility] 04/10/2015, 2:10 PM

## 2015-04-13 ENCOUNTER — Ambulatory Visit (HOSPITAL_COMMUNITY): Admit: 2015-04-13 | Payer: Federal, State, Local not specified - PPO

## 2016-02-01 ENCOUNTER — Encounter: Payer: Self-pay | Admitting: Family Medicine

## 2016-02-01 ENCOUNTER — Ambulatory Visit (INDEPENDENT_AMBULATORY_CARE_PROVIDER_SITE_OTHER): Payer: Federal, State, Local not specified - PPO | Admitting: Family Medicine

## 2016-02-01 VITALS — BP 98/62 | HR 99 | Temp 98.5°F | Wt 182.0 lb

## 2016-02-01 DIAGNOSIS — J0100 Acute maxillary sinusitis, unspecified: Secondary | ICD-10-CM

## 2016-02-01 MED ORDER — AMOXICILLIN-POT CLAVULANATE 875-125 MG PO TABS: 1.0000 | ORAL_TABLET | Freq: Two times a day (BID) | ORAL | Status: DC

## 2016-02-01 NOTE — Patient Instructions (Signed)
Start augmentin today.   Drink plenty of fluids, take tylenol as needed, and gargle with warm salt water for your throat.  This should gradually improve.   Take care.  Let us know if you have other concerns.

## 2016-02-01 NOTE — Progress Notes (Signed)
Pre visit review using our clinic review tool, if applicable. No additional management support is needed unless otherwise documented below in the visit note.  1510 month old son, recently sick with AOM.  Not breastfeeding.   He sx started about 2 weeks.  Stuffy, headache, discolored mucous.  Cough with sputum.  Sinus pressure.  No vomiting.  No fevers known.  No rash.   Taking mucinex and drinking plenty of water.    Meds, vitals, and allergies reviewed.   ROS: Per HPI unless specifically indicated in ROS section   GEN: nad, alert and oriented HEENT: mucous membranes moist, tm w/o erythema, nasal exam w/o erythema, clear discharge noted,  OP with cobblestoning, sinuses ttp x 4 NECK: supple w/o LA CV: rrr.   PULM: ctab, no inc wob EXT: no edema

## 2016-02-02 DIAGNOSIS — J0100 Acute maxillary sinusitis, unspecified: Secondary | ICD-10-CM | POA: Insufficient documentation

## 2016-02-02 NOTE — Assessment & Plan Note (Signed)
Nontoxic, rest and fluids, augmentin, f/u prn.  She agrees.

## 2016-09-17 ENCOUNTER — Ambulatory Visit (INDEPENDENT_AMBULATORY_CARE_PROVIDER_SITE_OTHER): Payer: Federal, State, Local not specified - PPO

## 2016-09-17 ENCOUNTER — Ambulatory Visit (INDEPENDENT_AMBULATORY_CARE_PROVIDER_SITE_OTHER): Payer: Federal, State, Local not specified - PPO | Admitting: Sports Medicine

## 2016-09-17 ENCOUNTER — Encounter: Payer: Self-pay | Admitting: Sports Medicine

## 2016-09-17 DIAGNOSIS — M79671 Pain in right foot: Secondary | ICD-10-CM

## 2016-09-17 DIAGNOSIS — M722 Plantar fascial fibromatosis: Secondary | ICD-10-CM | POA: Diagnosis not present

## 2016-09-17 DIAGNOSIS — M79672 Pain in left foot: Secondary | ICD-10-CM

## 2016-09-17 MED ORDER — DICLOFENAC SODIUM 75 MG PO TBEC: 75.0000 mg | DELAYED_RELEASE_TABLET | Freq: Two times a day (BID) | ORAL | 0 refills | Status: DC

## 2016-09-17 MED ORDER — TRIAMCINOLONE ACETONIDE 10 MG/ML IJ SUSP: 10.0000 mg | Freq: Once | INTRAMUSCULAR | Status: DC

## 2016-09-17 NOTE — Patient Instructions (Signed)

## 2016-09-17 NOTE — Progress Notes (Signed)
Subjective: Bethany Perkins is a 26 y.o. female patient presents to office with complaint of heel pain on the left>right. 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 Patient has treated this problem with inserts, change of shoes, soaking, massage, volteran get, ice with no complete relief. Denies any other pedal complaints.   Patient Active Problem List   Diagnosis Date Noted  . Acute non-recurrent maxillary sinusitis 02/02/2016  . S/P cesarean section 04/07/2015  . PROM (premature rupture of membranes) 04/06/2015  . Primigravida 08/05/2014  . Encounter for routine gynecological examination 03/23/2013  . ANXIETY DISORDER 04/24/2010  . DYSMENORRHEA 08/11/2007  . Asthma 03/18/2007    Current Outpatient Prescriptions on File Prior to Visit  Medication Sig Dispense Refill  . amoxicillin-clavulanate (AUGMENTIN) 875-125 MG tablet Take 1 tablet by mouth 2 (two) times daily. 20 tablet 0   No current facility-administered medications on file prior to visit.     No Known Allergies  Objective: Physical Exam General: The patient is alert and oriented x3 in no acute distress.  Dermatology: Skin is warm, dry and supple bilateral lower extremities. Nails 1-10 are normal. There is no erythema, edema, no eccymosis, no open lesions present. Integument is otherwise unremarkable.  Vascular: Dorsalis Pedis pulse and Posterior Tibial pulse are 2/4 bilateral. Capillary fill time is immediate to all digits.  Neurological: Grossly intact to light touch with an achilles reflex of +2/5 and a negative Tinel's sign bilateral.  Musculoskeletal: Tenderness to palpation at the medial calcaneal tubercale and through the insertion of the plantar fascia on the left>right foot. No pain with compression of calcaneus bilateral. No pain with tuning fork to calcaneus bilateral. No pain with calf compression bilateral. There is decreased Ankle  joint range of motion bilateral. All other joints range of motion within normal limits bilateral. Strength 5/5 in all groups bilateral. Supinated foot type.   Gait: Unassisted, Antalgic avoid weight on heels  Xray, Right/Left foot:  Normal osseous mineralization. Joint spaces preserved. No fracture/dislocation/boney destruction. + Os trigonium syndrome. + Calcaneal spur present with mild thickening of plantar fascia. No other soft tissue abnormalities or radiopaque foreign bodies.   Assessment and Plan: Problem List Items Addressed This Visit    None    Visit Diagnoses    Plantar fasciitis    -  Primary   Relevant Medications   triamcinolone acetonide (KENALOG) 10 MG/ML injection 10 mg   diclofenac (VOLTAREN) 75 MG EC tablet   Foot pain, bilateral       Relevant Medications   triamcinolone acetonide (KENALOG) 10 MG/ML injection 10 mg   diclofenac (VOLTAREN) 75 MG EC tablet   Other Relevant Orders   DG Foot 2 Views Right   DG Foot 2 Views Left      -Complete examination performed.  -Xrays reviewed -Discussed with patient in detail the condition of plantar fasciitis, how this occurs and general treatment options. Explained both conservative and surgical treatments.  -After oral consent and aseptic prep, injected a mixture containing 1 ml of 2% plain lidocaine, 1 ml 0.5% plain marcaine, 0.5 ml of kenalog 10 and 0.5 ml of dexamethasone phosphate into left and right heel. Post-injection care discussed with patient.  -Rx Diclofenac  -Recommended good supportive shoes and advised use of OTC insert. Explained to patient that if these orthoses work well, we will continue with these. If these do not improve her condition and  pain, we will consider custom  molded orthoses. - Explained in detail the use of the fascial brace for the right and left which was dispensed at today's visit. -Explained and dispensed to patient daily stretching exercises. -Recommend patient to ice affected area 1-2x  daily. -Patient to return to office in 4 weeks for follow up or sooner if problems or questions arise. Will consider steroid dose pak if symptoms still present at next visit wanted to remain light with medication by mouth since patient is trying to get pregnant.   Asencion Islam, DPM

## 2016-10-08 ENCOUNTER — Encounter: Payer: Self-pay | Admitting: Family Medicine

## 2016-10-08 ENCOUNTER — Ambulatory Visit (INDEPENDENT_AMBULATORY_CARE_PROVIDER_SITE_OTHER): Payer: Federal, State, Local not specified - PPO | Admitting: Family Medicine

## 2016-10-08 DIAGNOSIS — B9789 Other viral agents as the cause of diseases classified elsewhere: Secondary | ICD-10-CM

## 2016-10-08 DIAGNOSIS — J069 Acute upper respiratory infection, unspecified: Secondary | ICD-10-CM | POA: Diagnosis not present

## 2016-10-08 NOTE — Progress Notes (Signed)
Pre visit review using our clinic review tool, if applicable. No additional management support is needed unless otherwise documented below in the visit note. 

## 2016-10-08 NOTE — Patient Instructions (Signed)
Rest and fluids.  Cough suppresant over the counter as needed.

## 2016-10-08 NOTE — Progress Notes (Signed)
   Subjective:    Patient ID: Bethany CowmanKristin P Perkins, female    DOB: Aug 24, 1991, 26 y.o.   MRN: [id number]  Cough  This is a new problem. The current episode started yesterday. The problem has been rapidly worsening. The cough is productive of sputum. Associated symptoms include shortness of breath. Pertinent negatives include no ear congestion, ear pain, fever, headaches, myalgias, nasal congestion, sore throat or wheezing. Associated symptoms comments:  Fatigue chest tightness  nausea  no face pain. The symptoms are aggravated by lying down. Risk factors: nonsmoker. She has tried OTC cough suppressant for the symptoms. The treatment provided mild relief. Her past medical history is significant for asthma. There is no history of COPD or environmental allergies. childhood asthma    Sick contacts: croup, viral infection  Review of Systems  Constitutional: Negative for fever.  HENT: Negative for ear pain and sore throat.   Respiratory: Positive for cough and shortness of breath. Negative for wheezing.   Musculoskeletal: Negative for myalgias.  Allergic/Immunologic: Negative for environmental allergies.  Neurological: Negative for headaches.   Blood pressure 118/74, pulse (!) 112, temperature 98.7 F (37.1 C), temperature source Oral, height 5' 7.5" (1.715 m), weight 171 lb 12 oz (77.9 kg), last menstrual period 10/02/2016, unknown if currently breastfeeding.     Objective:   Physical Exam  Constitutional: Vital signs are normal. She appears well-developed and well-nourished. She is cooperative.  Non-toxic appearance. She does not appear ill. No distress.  HENT:  Head: Normocephalic.  Right Ear: Hearing, tympanic membrane, external ear and ear canal normal. Tympanic membrane is not erythematous, not retracted and not bulging.  Left Ear: Hearing, tympanic membrane, external ear and ear canal normal. Tympanic membrane is not erythematous, not retracted and not bulging.  Nose: Mucosal edema and  rhinorrhea present. Right sinus exhibits no maxillary sinus tenderness and no frontal sinus tenderness. Left sinus exhibits no maxillary sinus tenderness and no frontal sinus tenderness.  Mouth/Throat: Uvula is midline, oropharynx is clear and moist and mucous membranes are normal.  Eyes: Conjunctivae, EOM and lids are normal. Pupils are equal, round, and reactive to light. Lids are everted and swept, no foreign bodies found.  Neck: Trachea normal and normal range of motion. Neck supple. Carotid bruit is not present. No thyroid mass and no thyromegaly present.  Cardiovascular: Normal rate, regular rhythm, S1 normal, S2 normal, normal heart sounds, intact distal pulses and normal pulses.  Exam reveals no gallop and no friction rub.   No murmur heard. Pulmonary/Chest: Effort normal and breath sounds normal. No tachypnea. No respiratory distress. She has no decreased breath sounds. She has no wheezes. She has no rhonchi. She has no rales.  Neurological: She is alert.  Skin: Skin is warm, dry and intact. No rash noted.  Psychiatric: Her speech is normal and behavior is normal. Judgment normal. Her mood appears not anxious. Cognition and memory are normal. She does not exhibit a depressed mood.          Assessment & Plan:

## 2016-10-08 NOTE — Assessment & Plan Note (Signed)
Not clearly flu.  No clear bacterial infection.  Symptomatic care.

## 2016-10-15 ENCOUNTER — Encounter: Payer: Self-pay | Admitting: Sports Medicine

## 2016-10-15 ENCOUNTER — Ambulatory Visit (INDEPENDENT_AMBULATORY_CARE_PROVIDER_SITE_OTHER): Payer: Federal, State, Local not specified - PPO | Admitting: Sports Medicine

## 2016-10-15 DIAGNOSIS — M79671 Pain in right foot: Secondary | ICD-10-CM

## 2016-10-15 DIAGNOSIS — M722 Plantar fascial fibromatosis: Secondary | ICD-10-CM | POA: Diagnosis not present

## 2016-10-15 DIAGNOSIS — M79672 Pain in left foot: Secondary | ICD-10-CM

## 2016-10-15 NOTE — Progress Notes (Signed)
Subjective: Bethany Perkins is a 26 y.o. female returns to office for follow up evaluation after Left and Right heel injection for plantar fasciitis, injection #1 administered 3 weeks ago. Patient states that the injection seems to helpher pain; feels better. Patient denies any recent changes in medications or new problems since last visit.   Patient Active Problem List   Diagnosis Date Noted  . Viral URI with cough 10/08/2016  . Acute non-recurrent maxillary sinusitis 02/02/2016  . S/P cesarean section 04/07/2015  . PROM (premature rupture of membranes) 04/06/2015  . Primigravida 08/05/2014  . Encounter for routine gynecological examination 03/23/2013  . ANXIETY DISORDER 04/24/2010  . DYSMENORRHEA 08/11/2007  . Asthma 03/18/2007    Current Outpatient Prescriptions on File Prior to Visit  Medication Sig Dispense Refill  . diclofenac (VOLTAREN) 75 MG EC tablet Take 1 tablet (75 mg total) by mouth 2 (two) times daily. 30 tablet 0   Current Facility-Administered Medications on File Prior to Visit  Medication Dose Route Frequency Provider Last Rate Last Dose  . triamcinolone acetonide (KENALOG) 10 MG/ML injection 10 mg  10 mg Other Once [employer] Jovane Foutz, DPM        No Known Allergies  Objective:   General:  Alert and oriented x 3, in no acute distress  Dermatology: Skin is warm, dry, and supple bilateral. Nails are within normal limits. There is no lower extremity erythema, no eccymosis, no open lesions present bilateral.   Vascular: Dorsalis Pedis and Posterior Tibial pedal pulses are 2/4 bilateral. + hair growth noted bilateral. Capillary Fill Time is 3 seconds in all digits. No varicosities, No edema bilateral lower extremities.   Neurological: Sensation grossly intact to light touch with an achilles reflex of +2 and a  negative Tinel's sign bilateral. Vibratory, sharp/dull, Semmes Weinstein Monofilament within normal limits.   Musculoskeletal: There is decreased tenderness to  palpation at the medial calcaneal tubercale and through the insertion of the plantar fascia bilateral. No pain with compression to calcaneus or application of tuning fork. There is decreased Ankle joint range of motion bilateral. All other joints range of motion  within normal limits bilateral. Strength 5/5 bilateral.   Assessment and Plan: Problem List Items Addressed This Visit    None    Visit Diagnoses    Foot pain, bilateral    -  Primary   Plantar fasciitis          -Complete examination performed.  -Previous x-rays reviewed. -Re-Discussed with patient in detail the condition of plantar fasciitis, how this occurs related to the foot type of the patient and general treatment options. - Since pain is better will monitor; wean from brace after 1 month -Continue with stretching, icing, good supportive shoes, inserts daily.  -Discussed long term care and reocurrence; will closely monitor; if fails to improve will consider other treatment modalities.  -Patient to return to office as needed for follow up or sooner if problems or questions arise.  Asencion Islamitorya Genae Strine, DPM

## 2016-11-13 DIAGNOSIS — Z01419 Encounter for gynecological examination (general) (routine) without abnormal findings: Secondary | ICD-10-CM | POA: Diagnosis not present

## 2016-11-13 DIAGNOSIS — Z6826 Body mass index (BMI) 26.0-26.9, adult: Secondary | ICD-10-CM | POA: Diagnosis not present

## 2017-04-11 DIAGNOSIS — N911 Secondary amenorrhea: Secondary | ICD-10-CM | POA: Diagnosis not present

## 2017-04-17 DIAGNOSIS — Z3482 Encounter for supervision of other normal pregnancy, second trimester: Secondary | ICD-10-CM | POA: Diagnosis not present

## 2017-04-17 DIAGNOSIS — Z3401 Encounter for supervision of normal first pregnancy, first trimester: Secondary | ICD-10-CM | POA: Diagnosis not present

## 2017-04-17 DIAGNOSIS — Z3481 Encounter for supervision of other normal pregnancy, first trimester: Secondary | ICD-10-CM | POA: Diagnosis not present

## 2017-04-17 DIAGNOSIS — Z113 Encounter for screening for infections with a predominantly sexual mode of transmission: Secondary | ICD-10-CM | POA: Diagnosis not present

## 2017-04-17 DIAGNOSIS — Z3685 Encounter for antenatal screening for Streptococcus B: Secondary | ICD-10-CM | POA: Diagnosis not present

## 2017-04-17 LAB — OB RESULTS CONSOLE ABO/RH: RH Type: POSITIVE

## 2017-04-17 LAB — OB RESULTS CONSOLE RPR: RPR: NONREACTIVE

## 2017-04-17 LAB — OB RESULTS CONSOLE HIV ANTIBODY (ROUTINE TESTING): HIV: NONREACTIVE

## 2017-04-17 LAB — OB RESULTS CONSOLE GC/CHLAMYDIA
CHLAMYDIA, DNA PROBE: NEGATIVE
GC PROBE AMP, GENITAL: NEGATIVE

## 2017-04-17 LAB — OB RESULTS CONSOLE RUBELLA ANTIBODY, IGM: Rubella: IMMUNE

## 2017-04-17 LAB — OB RESULTS CONSOLE ANTIBODY SCREEN: Antibody Screen: NEGATIVE

## 2017-04-17 LAB — OB RESULTS CONSOLE HEPATITIS B SURFACE ANTIGEN: Hepatitis B Surface Ag: NEGATIVE

## 2017-05-13 DIAGNOSIS — Z113 Encounter for screening for infections with a predominantly sexual mode of transmission: Secondary | ICD-10-CM | POA: Diagnosis not present

## 2017-05-13 DIAGNOSIS — Z3480 Encounter for supervision of other normal pregnancy, unspecified trimester: Secondary | ICD-10-CM | POA: Diagnosis not present

## 2017-05-13 DIAGNOSIS — Z3401 Encounter for supervision of normal first pregnancy, first trimester: Secondary | ICD-10-CM | POA: Diagnosis not present

## 2017-05-19 DIAGNOSIS — Z3A12 12 weeks gestation of pregnancy: Secondary | ICD-10-CM | POA: Diagnosis not present

## 2017-05-19 DIAGNOSIS — Z3683 Encounter for fetal screening for congenital cardiac abnormalities: Secondary | ICD-10-CM | POA: Diagnosis not present

## 2017-06-10 DIAGNOSIS — Z3480 Encounter for supervision of other normal pregnancy, unspecified trimester: Secondary | ICD-10-CM | POA: Diagnosis not present

## 2017-07-01 DIAGNOSIS — Z363 Encounter for antenatal screening for malformations: Secondary | ICD-10-CM | POA: Diagnosis not present

## 2017-07-01 DIAGNOSIS — Z3A18 18 weeks gestation of pregnancy: Secondary | ICD-10-CM | POA: Diagnosis not present

## 2017-08-12 DIAGNOSIS — K080 Exfoliation of teeth due to systemic causes: Secondary | ICD-10-CM | POA: Diagnosis not present

## 2017-08-28 DIAGNOSIS — Z23 Encounter for immunization: Secondary | ICD-10-CM | POA: Diagnosis not present

## 2017-08-28 DIAGNOSIS — Z3480 Encounter for supervision of other normal pregnancy, unspecified trimester: Secondary | ICD-10-CM | POA: Diagnosis not present

## 2017-10-28 DIAGNOSIS — Z3480 Encounter for supervision of other normal pregnancy, unspecified trimester: Secondary | ICD-10-CM | POA: Diagnosis not present

## 2017-11-11 DIAGNOSIS — Z3483 Encounter for supervision of other normal pregnancy, third trimester: Secondary | ICD-10-CM | POA: Diagnosis not present

## 2017-11-17 DIAGNOSIS — Z3483 Encounter for supervision of other normal pregnancy, third trimester: Secondary | ICD-10-CM | POA: Diagnosis not present

## 2017-11-24 ENCOUNTER — Encounter (HOSPITAL_COMMUNITY): Payer: Self-pay | Admitting: *Deleted

## 2017-11-24 ENCOUNTER — Encounter (HOSPITAL_COMMUNITY)
Admission: RE | Admit: 2017-11-24 | Discharge: 2017-11-24 | Disposition: A | Discharge status: Home / Self Care | Payer: 59 | Source: Ambulatory Visit | Admitting: Obstetrics and Gynecology

## 2017-11-24 ENCOUNTER — Encounter (HOSPITAL_COMMUNITY): Payer: Self-pay | Admitting: Anesthesiology

## 2017-11-24 ENCOUNTER — Telehealth (HOSPITAL_COMMUNITY): Payer: Self-pay | Admitting: *Deleted

## 2017-11-24 DIAGNOSIS — Z3483 Encounter for supervision of other normal pregnancy, third trimester: Secondary | ICD-10-CM | POA: Diagnosis not present

## 2017-11-24 LAB — OB RESULTS CONSOLE GBS: STREP GROUP B AG: NEGATIVE

## 2017-11-24 NOTE — Anesthesia Preprocedure Evaluation (Addendum)
Anesthesia Evaluation  Patient identified by MRN, date of birth, ID band Patient awake    Reviewed: Allergy & Precautions, NPO status , Patient's Chart, lab work & pertinent test results  Airway Mallampati: II  TM Distance: >3 FB Neck ROM: Full    Dental no notable dental hx. (+) Teeth Intact   Pulmonary neg pulmonary ROS, asthma ,    Pulmonary exam normal breath sounds clear to auscultation       Cardiovascular negative cardio ROS Normal cardiovascular exam Rhythm:Regular Rate:Normal     Neuro/Psych Anxiety negative neurological ROS     GI/Hepatic negative GI ROS, Neg liver ROS,   Endo/Other  Obesity  Renal/GU negative Renal ROS  negative genitourinary   Musculoskeletal negative musculoskeletal ROS (+)   Abdominal   Peds  Hematology  (+) anemia ,   Anesthesia Other Findings   Reproductive/Obstetrics (+) Pregnancy Previous C/Section                           Anesthesia Physical Anesthesia Plan  ASA: II  Anesthesia Plan: Spinal   Post-op Pain Management:    Induction:   PONV Risk Score and Plan: 4 or greater and Scopolamine patch - Pre-op, Dexamethasone, Ondansetron and Treatment may vary due to age or medical condition  Airway Management Planned: Natural Airway  Additional Equipment:   Intra-op Plan:   Post-operative Plan:   Informed Consent: I have reviewed the patients History and Physical, chart, labs and discussed the procedure including the risks, benefits and alternatives for the proposed anesthesia with the patient or authorized representative who has indicated his/her understanding and acceptance.     Plan Discussed with: CRNA, Anesthesiologist and Surgeon  Anesthesia Plan Comments:        Anesthesia Quick Evaluation

## 2017-11-24 NOTE — Telephone Encounter (Signed)
Preadmission screen  

## 2017-11-24 NOTE — H&P (Addendum)
Bethany CowmanKristin P Perkins is a 27 y.o. female presenting for repeat c-section. OB History    Gravida  2   Para  1   Term  1   Preterm      AB      Living  1     SAB      TAB      Ectopic      Multiple  0   Live Births  1          Past Medical History:  Diagnosis Date  . Allergy    allergic rhinitis seasonal  . Anxiety    in high school  . Asthma    mild  . Dysmenorrhea   . Fx wrist 06/2003   Past Surgical History:  Procedure Laterality Date  . CESAREAN SECTION N/A 04/07/2015   Procedure: CESAREAN SECTION;  Surgeon: Zelphia CairoGretchen Zamaya Rapaport, MD;  Location: [facility];  Service: Obstetrics;  Laterality: N/A;  . TONSILLECTOMY    . WISDOM TOOTH EXTRACTION     Family History: family history includes Cancer in her maternal grandfather and paternal grandmother; Dementia in her paternal grandmother; Diabetes in her maternal grandfather and paternal grandmother; Heart disease in her maternal grandfather; Hypertension in her maternal grandfather and paternal grandmother. Social History:  reports that she has never smoked. She has never used smokeless tobacco. She reports that she does not drink alcohol or use drugs.     Maternal Diabetes: No Genetic Screening: Normal Maternal Ultrasounds/Referrals: Normal Fetal Ultrasounds or other Referrals:  None Maternal Substance Abuse:  No Significant Maternal Medications:  None Significant Maternal Lab Results:  None Other Comments:  None  ROS History   Last menstrual period 02/20/2017, unknown if currently breastfeeding. Exam Physical Exam  Gen - NAD Abd - gravid, NT Ext - NT, no edema  Prenatal labs: ABO, Rh: O/Positive/-- (08/23 0000) Antibody: Negative (08/23 0000) Rubella: Immune (08/23 0000) RPR: Nonreactive (08/23 0000)  HBsAg: Negative (08/23 0000)  HIV: Non-reactive (08/23 0000)  GBS: Negative (04/01 0000)   Assessment/Plan: Reat c-section  R/b/a discussed, questions answered, informed consent  Zelphia CairoGretchen  Kayven Aldaco 11/24/2017, 2:45 PM

## 2017-11-25 ENCOUNTER — Encounter (HOSPITAL_COMMUNITY)
Admission: RE | Disposition: A | Discharge status: Home / Self Care | Payer: Self-pay | Source: Ambulatory Visit | Attending: Obstetrics and Gynecology

## 2017-11-25 ENCOUNTER — Inpatient Hospital Stay (HOSPITAL_COMMUNITY)
Admission: RE | Admit: 2017-11-25 | Discharge: 2017-11-27 | DRG: 788 | Disposition: A | Discharge status: Home / Self Care | Payer: 59 | Source: Ambulatory Visit | Attending: Obstetrics and Gynecology | Admitting: Obstetrics and Gynecology

## 2017-11-25 ENCOUNTER — Other Ambulatory Visit: Payer: Self-pay

## 2017-11-25 ENCOUNTER — Inpatient Hospital Stay (HOSPITAL_COMMUNITY): Payer: 59 | Admitting: Anesthesiology

## 2017-11-25 ENCOUNTER — Encounter (HOSPITAL_COMMUNITY): Payer: Self-pay

## 2017-11-25 DIAGNOSIS — O34211 Maternal care for low transverse scar from previous cesarean delivery: Principal | ICD-10-CM | POA: Diagnosis present

## 2017-11-25 DIAGNOSIS — O9902 Anemia complicating childbirth: Secondary | ICD-10-CM | POA: Diagnosis not present

## 2017-11-25 DIAGNOSIS — O34219 Maternal care for unspecified type scar from previous cesarean delivery: Secondary | ICD-10-CM | POA: Diagnosis not present

## 2017-11-25 DIAGNOSIS — Z3A39 39 weeks gestation of pregnancy: Secondary | ICD-10-CM

## 2017-11-25 DIAGNOSIS — Z98891 History of uterine scar from previous surgery: Secondary | ICD-10-CM

## 2017-11-25 DIAGNOSIS — O99214 Obesity complicating childbirth: Secondary | ICD-10-CM | POA: Diagnosis present

## 2017-11-25 DIAGNOSIS — E669 Obesity, unspecified: Secondary | ICD-10-CM | POA: Diagnosis present

## 2017-11-25 LAB — CBC
HEMATOCRIT: 37 % (ref 36.0–46.0)
HEMOGLOBIN: 12 g/dL (ref 12.0–15.0)
MCH: 27.2 pg (ref 26.0–34.0)
MCHC: 32.4 g/dL (ref 30.0–36.0)
MCV: 83.9 fL (ref 78.0–100.0)
Platelets: 293 10*3/uL (ref 150–400)
RBC: 4.41 MIL/uL (ref 3.87–5.11)
RDW: 15.6 % — ABNORMAL HIGH (ref 11.5–15.5)
WBC: 11.7 10*3/uL — ABNORMAL HIGH (ref 4.0–10.5)

## 2017-11-25 LAB — TYPE AND SCREEN
ABO/RH(D): O POS
ANTIBODY SCREEN: NEGATIVE

## 2017-11-25 LAB — RPR: RPR: NONREACTIVE

## 2017-11-25 SURGERY — Surgical Case
Anesthesia: Spinal

## 2017-11-25 MED ORDER — OXYTOCIN 10 UNIT/ML IJ SOLN
INTRAVENOUS | Status: DC | PRN
  Administered 2017-11-25: 40 [IU] via INTRAVENOUS

## 2017-11-25 MED ORDER — IBUPROFEN 600 MG PO TABS
600.0000 mg | ORAL_TABLET | Freq: Four times a day (QID) | ORAL | Status: DC
  Administered 2017-11-25 – 2017-11-27 (×7): 600 mg via ORAL
  Filled 2017-11-25 (×7): qty 1

## 2017-11-25 MED ORDER — DEXAMETHASONE SODIUM PHOSPHATE 4 MG/ML IJ SOLN
INTRAMUSCULAR | Status: DC | PRN
  Administered 2017-11-25: 4 mg via INTRAVENOUS

## 2017-11-25 MED ORDER — CEFAZOLIN SODIUM-DEXTROSE 2-4 GM/100ML-% IV SOLN
INTRAVENOUS | Status: AC
  Filled 2017-11-25: qty 100

## 2017-11-25 MED ORDER — MEASLES, MUMPS & RUBELLA VAC ~~LOC~~ INJ: 0.5000 mL | INJECTION | Freq: Once | SUBCUTANEOUS | Status: DC

## 2017-11-25 MED ORDER — ONDANSETRON HCL 4 MG/2ML IJ SOLN
INTRAMUSCULAR | Status: AC
  Filled 2017-11-25: qty 2

## 2017-11-25 MED ORDER — SCOPOLAMINE 1 MG/3DAYS TD PT72
1.0000 | MEDICATED_PATCH | Freq: Once | TRANSDERMAL | Status: DC
  Administered 2017-11-25: 1.5 mg via TRANSDERMAL
  Filled 2017-11-25: qty 1

## 2017-11-25 MED ORDER — TETANUS-DIPHTH-ACELL PERTUSSIS 5-2.5-18.5 LF-MCG/0.5 IM SUSP: 0.5000 mL | Freq: Once | INTRAMUSCULAR | Status: DC

## 2017-11-25 MED ORDER — PRENATAL MULTIVITAMIN CH
1.0000 | ORAL_TABLET | Freq: Every day | ORAL | Status: DC
  Administered 2017-11-26: 1 via ORAL
  Filled 2017-11-25: qty 1

## 2017-11-25 MED ORDER — NALBUPHINE HCL 10 MG/ML IJ SOLN: 5.0000 mg | INTRAMUSCULAR | Status: DC | PRN

## 2017-11-25 MED ORDER — SENNOSIDES-DOCUSATE SODIUM 8.6-50 MG PO TABS
2.0000 | ORAL_TABLET | ORAL | Status: DC
  Administered 2017-11-26 (×2): 2 via ORAL
  Filled 2017-11-25 (×2): qty 2

## 2017-11-25 MED ORDER — PHENYLEPHRINE 8 MG IN D5W 100 ML (0.08MG/ML) PREMIX OPTIME
INJECTION | INTRAVENOUS | Status: DC | PRN
  Administered 2017-11-25: 60 ug/min via INTRAVENOUS

## 2017-11-25 MED ORDER — SODIUM CHLORIDE 0.9 % IR SOLN
Status: DC | PRN
  Administered 2017-11-25: 1

## 2017-11-25 MED ORDER — WITCH HAZEL-GLYCERIN EX PADS: 1.0000 "application " | MEDICATED_PAD | CUTANEOUS | Status: DC | PRN

## 2017-11-25 MED ORDER — DIPHENHYDRAMINE HCL 25 MG PO CAPS: 25.0000 mg | ORAL_CAPSULE | Freq: Four times a day (QID) | ORAL | Status: DC | PRN

## 2017-11-25 MED ORDER — DIPHENHYDRAMINE HCL 50 MG/ML IJ SOLN: 12.5000 mg | INTRAMUSCULAR | Status: DC | PRN

## 2017-11-25 MED ORDER — LACTATED RINGERS IV SOLN: INTRAVENOUS | Status: DC

## 2017-11-25 MED ORDER — DEXTROSE IN LACTATED RINGERS 5 % IV SOLN
INTRAVENOUS | Status: DC
  Administered 2017-11-25: 20:00:00 via INTRAVENOUS

## 2017-11-25 MED ORDER — LACTATED RINGERS IV SOLN
INTRAVENOUS | Status: DC | PRN
  Administered 2017-11-25 (×2): via INTRAVENOUS

## 2017-11-25 MED ORDER — SIMETHICONE 80 MG PO CHEW
80.0000 mg | CHEWABLE_TABLET | ORAL | Status: DC
  Administered 2017-11-26 (×2): 80 mg via ORAL
  Filled 2017-11-25 (×2): qty 1

## 2017-11-25 MED ORDER — MENTHOL 3 MG MT LOZG: 1.0000 | LOZENGE | OROMUCOSAL | Status: DC | PRN

## 2017-11-25 MED ORDER — FENTANYL CITRATE (PF) 100 MCG/2ML IJ SOLN: 25.0000 ug | INTRAMUSCULAR | Status: DC | PRN

## 2017-11-25 MED ORDER — OXYTOCIN 40 UNITS IN LACTATED RINGERS INFUSION - SIMPLE MED: 2.5000 [IU]/h | INTRAVENOUS | Status: AC

## 2017-11-25 MED ORDER — DIPHENHYDRAMINE HCL 25 MG PO CAPS
25.0000 mg | ORAL_CAPSULE | ORAL | Status: DC | PRN
  Filled 2017-11-25: qty 1

## 2017-11-25 MED ORDER — MORPHINE SULFATE (PF) 0.5 MG/ML IJ SOLN
INTRAMUSCULAR | Status: AC
  Filled 2017-11-25: qty 10

## 2017-11-25 MED ORDER — MORPHINE SULFATE (PF) 0.5 MG/ML IJ SOLN
INTRAMUSCULAR | Status: DC | PRN
  Administered 2017-11-25: .2 mg via INTRATHECAL

## 2017-11-25 MED ORDER — FENTANYL CITRATE (PF) 100 MCG/2ML IJ SOLN
INTRAMUSCULAR | Status: AC
  Filled 2017-11-25: qty 2

## 2017-11-25 MED ORDER — NALBUPHINE HCL 10 MG/ML IJ SOLN: 5.0000 mg | Freq: Once | INTRAMUSCULAR | Status: DC | PRN

## 2017-11-25 MED ORDER — NALOXONE HCL 0.4 MG/ML IJ SOLN: 0.4000 mg | INTRAMUSCULAR | Status: DC | PRN

## 2017-11-25 MED ORDER — ONDANSETRON HCL 4 MG/2ML IJ SOLN
INTRAMUSCULAR | Status: DC | PRN
  Administered 2017-11-25: 4 mg via INTRAVENOUS

## 2017-11-25 MED ORDER — LACTATED RINGERS IV SOLN
INTRAVENOUS | Status: DC | PRN
  Administered 2017-11-25: 08:00:00 via INTRAVENOUS

## 2017-11-25 MED ORDER — DEXAMETHASONE SODIUM PHOSPHATE 4 MG/ML IJ SOLN
INTRAMUSCULAR | Status: AC
  Filled 2017-11-25: qty 1

## 2017-11-25 MED ORDER — LACTATED RINGERS IV SOLN
INTRAVENOUS | Status: DC
  Administered 2017-11-25: 06:00:00 via INTRAVENOUS

## 2017-11-25 MED ORDER — OXYCODONE-ACETAMINOPHEN 5-325 MG PO TABS
1.0000 | ORAL_TABLET | ORAL | Status: DC | PRN
  Administered 2017-11-26 – 2017-11-27 (×4): 1 via ORAL
  Filled 2017-11-25 (×5): qty 1

## 2017-11-25 MED ORDER — ACETAMINOPHEN 325 MG PO TABS: 650.0000 mg | ORAL_TABLET | ORAL | Status: DC | PRN

## 2017-11-25 MED ORDER — METOCLOPRAMIDE HCL 5 MG/ML IJ SOLN: 10.0000 mg | Freq: Once | INTRAMUSCULAR | Status: DC | PRN

## 2017-11-25 MED ORDER — CEFAZOLIN SODIUM-DEXTROSE 2-4 GM/100ML-% IV SOLN
2.0000 g | INTRAVENOUS | Status: DC
  Filled 2017-11-25: qty 100

## 2017-11-25 MED ORDER — MEPERIDINE HCL 25 MG/ML IJ SOLN: 6.2500 mg | INTRAMUSCULAR | Status: DC | PRN

## 2017-11-25 MED ORDER — NALOXONE HCL 4 MG/10ML IJ SOLN: 1.0000 ug/kg/h | INTRAVENOUS | Status: DC | PRN

## 2017-11-25 MED ORDER — SIMETHICONE 80 MG PO CHEW: 80.0000 mg | CHEWABLE_TABLET | ORAL | Status: DC | PRN

## 2017-11-25 MED ORDER — COCONUT OIL OIL
1.0000 "application " | TOPICAL_OIL | Status: DC | PRN
  Administered 2017-11-26: 1 via TOPICAL
  Filled 2017-11-25: qty 120

## 2017-11-25 MED ORDER — KETOROLAC TROMETHAMINE 30 MG/ML IJ SOLN
30.0000 mg | Freq: Four times a day (QID) | INTRAMUSCULAR | Status: AC | PRN
  Administered 2017-11-25: 30 mg via INTRAMUSCULAR

## 2017-11-25 MED ORDER — MEDROXYPROGESTERONE ACETATE 150 MG/ML IM SUSP: 150.0000 mg | INTRAMUSCULAR | Status: DC | PRN

## 2017-11-25 MED ORDER — OXYTOCIN 10 UNIT/ML IJ SOLN
INTRAMUSCULAR | Status: AC
  Filled 2017-11-25: qty 4

## 2017-11-25 MED ORDER — ONDANSETRON HCL 4 MG/2ML IJ SOLN: 4.0000 mg | Freq: Three times a day (TID) | INTRAMUSCULAR | Status: DC | PRN

## 2017-11-25 MED ORDER — OXYCODONE-ACETAMINOPHEN 5-325 MG PO TABS: 2.0000 | ORAL_TABLET | ORAL | Status: DC | PRN

## 2017-11-25 MED ORDER — FENTANYL CITRATE (PF) 100 MCG/2ML IJ SOLN
INTRAMUSCULAR | Status: DC | PRN
  Administered 2017-11-25: 20 ug via INTRATHECAL

## 2017-11-25 MED ORDER — SIMETHICONE 80 MG PO CHEW
80.0000 mg | CHEWABLE_TABLET | Freq: Three times a day (TID) | ORAL | Status: DC
  Administered 2017-11-25 – 2017-11-27 (×5): 80 mg via ORAL
  Filled 2017-11-25 (×5): qty 1

## 2017-11-25 MED ORDER — SODIUM CHLORIDE 0.9% FLUSH: 3.0000 mL | INTRAVENOUS | Status: DC | PRN

## 2017-11-25 MED ORDER — BUPIVACAINE IN DEXTROSE 0.75-8.25 % IT SOLN
INTRATHECAL | Status: DC | PRN
  Administered 2017-11-25: 1.8 mL via INTRATHECAL

## 2017-11-25 MED ORDER — KETOROLAC TROMETHAMINE 30 MG/ML IJ SOLN: 30.0000 mg | Freq: Four times a day (QID) | INTRAMUSCULAR | Status: AC | PRN

## 2017-11-25 MED ORDER — SOD CITRATE-CITRIC ACID 500-334 MG/5ML PO SOLN
30.0000 mL | Freq: Once | ORAL | Status: AC
  Administered 2017-11-25: 30 mL via ORAL
  Filled 2017-11-25: qty 15

## 2017-11-25 MED ORDER — CEFAZOLIN SODIUM-DEXTROSE 2-3 GM-%(50ML) IV SOLR
INTRAVENOUS | Status: DC | PRN
  Administered 2017-11-25: 2 g via INTRAVENOUS

## 2017-11-25 MED ORDER — KETOROLAC TROMETHAMINE 30 MG/ML IJ SOLN
INTRAMUSCULAR | Status: AC
  Filled 2017-11-25: qty 1

## 2017-11-25 MED ORDER — DIBUCAINE 1 % RE OINT: 1.0000 "application " | TOPICAL_OINTMENT | RECTAL | Status: DC | PRN

## 2017-11-25 SURGICAL SUPPLY — 33 items
ADH SKN CLS APL DERMABOND .7 (GAUZE/BANDAGES/DRESSINGS) ×1
CHLORAPREP W/TINT 26ML (MISCELLANEOUS) ×3 IMPLANT
CLAMP CORD UMBIL (MISCELLANEOUS) IMPLANT
CLOTH BEACON ORANGE TIMEOUT ST (SAFETY) ×3 IMPLANT
DERMABOND ADVANCED (GAUZE/BANDAGES/DRESSINGS) ×2
DERMABOND ADVANCED .7 DNX12 (GAUZE/BANDAGES/DRESSINGS) ×1 IMPLANT
DRSG OPSITE POSTOP 4X10 (GAUZE/BANDAGES/DRESSINGS) ×3 IMPLANT
ELECT REM PT RETURN 9FT ADLT (ELECTROSURGICAL) ×3
ELECTRODE REM PT RTRN 9FT ADLT (ELECTROSURGICAL) ×1 IMPLANT
EXTRACTOR VACUUM M CUP 4 TUBE (SUCTIONS) IMPLANT
EXTRACTOR VACUUM M CUP 4' TUBE (SUCTIONS)
GLOVE BIO SURGEON STRL SZ 6.5 (GLOVE) ×2 IMPLANT
GLOVE BIO SURGEONS STRL SZ 6.5 (GLOVE) ×1
GLOVE BIOGEL PI IND STRL 7.0 (GLOVE) ×2 IMPLANT
GLOVE BIOGEL PI INDICATOR 7.0 (GLOVE) ×4
GOWN STRL REUS W/TWL LRG LVL3 (GOWN DISPOSABLE) ×6 IMPLANT
KIT ABG SYR 3ML LUER SLIP (SYRINGE) IMPLANT
NDL HYPO 25X5/8 SAFETYGLIDE (NEEDLE) IMPLANT
NEEDLE HYPO 25X5/8 SAFETYGLIDE (NEEDLE) IMPLANT
NS IRRIG 1000ML POUR BTL (IV SOLUTION) ×3 IMPLANT
PACK C SECTION WH (CUSTOM PROCEDURE TRAY) ×3 IMPLANT
PAD OB MATERNITY 4.3X12.25 (PERSONAL CARE ITEMS) ×3 IMPLANT
PENCIL SMOKE EVAC W/HOLSTER (ELECTROSURGICAL) ×3 IMPLANT
SUT CHROMIC 0 CT 802H (SUTURE) IMPLANT
SUT CHROMIC 0 CTX 36 (SUTURE) ×9 IMPLANT
SUT MON AB-0 CT1 36 (SUTURE) ×3 IMPLANT
SUT PDS AB 0 CTX 60 (SUTURE) ×3 IMPLANT
SUT PLAIN 0 NONE (SUTURE) IMPLANT
SUT PLAIN 2 0 XLH (SUTURE) ×2 IMPLANT
SUT VIC AB 4-0 KS 27 (SUTURE) IMPLANT
SYR BULB 3OZ (MISCELLANEOUS) ×3 IMPLANT
TOWEL OR 17X24 6PK STRL BLUE (TOWEL DISPOSABLE) ×3 IMPLANT
TRAY FOLEY BAG SILVER LF 14FR (SET/KITS/TRAYS/PACK) IMPLANT

## 2017-11-25 NOTE — Op Note (Signed)
Cesarean Section Procedure Note   Bethany CowmanKristin P Dashner  11/25/2017  Indications: Scheduled Proceedure/Maternal Request   Pre-operative Diagnosis: previous X 1.   Post-operative Diagnosis: Same   Surgeon: Surgeon(s) and Role:    Zelphia Cairo* Kham Zuckerman, MD - Primary   Assistants: Odelia GageHeather K. RNFA  Anesthesia: spinal   Procedure Details:  The patient was seen in the Holding Room. The risks, benefits, complications, treatment options, and expected outcomes were discussed with the patient. The patient concurred with the proposed plan, giving informed consent. identified as Bethany CowmanKristin P Santibanez and the procedure verified as C-Section Delivery. A Time Out was held and the above information confirmed.  After induction of anesthesia, the patient was draped and prepped in the usual sterile manner. A transverse was made and carried down through the subcutaneous tissue to the fascia. Fascial incision was made and extended transversely. The fascia was separated from the underlying rectus tissue superiorly and inferiorly. The peritoneum was identified and entered. Peritoneal incision was extended longitudinally. The utero-vesical peritoneal reflection was incised transversely and the bladder flap was bluntly freed from the lower uterine segment. A low transverse uterine incision was made. Delivered from cephalic presentation was a viable female infant. Cord ph was not sent the umbilical cord was clamped and cut cord blood was obtained for evaluation. The placenta was removed Intact and appeared normal. The uterine outline, tubes and ovaries appeared normal}. The uterine incision was closed with running locked sutures of 0 vicryl.   Hemostasis was observed. Lavage was carried out until clear. The fascia was then reapproximated with running sutures of 0PDS. The subcuticular closure was performed using 2-0plain gut. The skin was closed with 4-0Vicryl.   Instrument, sponge, and needle counts were correct prior the  abdominal closure and were correct at the conclusion of the case.    Findings:   Estimated Blood Loss: pending  Urine Output: clear  Specimens: @ORSPECIMEN @   Complications: no complications  Disposition: PACU - hemodynamically stable.   Maternal Condition: stable   Baby condition / location:  Couplet care / Skin to Skin  Attending Attestation: I was present and scrubbed for the entire procedure.   Signed: Surgeon(s): Zelphia CairoAdkins, Jasin Brazel, MD

## 2017-11-25 NOTE — Anesthesia Postprocedure Evaluation (Signed)
Anesthesia Post Note  Patient: Bethany CowmanKristin P Perkins  Procedure(s) Performed: CESAREAN SECTION (N/A )     Patient location during evaluation: PACU Anesthesia Type: Spinal Level of consciousness: oriented and awake and alert Pain management: pain level controlled Vital Signs Assessment: post-procedure vital signs reviewed and stable Respiratory status: spontaneous breathing, respiratory function stable and nonlabored ventilation Cardiovascular status: blood pressure returned to baseline and stable Postop Assessment: no headache, no backache, no apparent nausea or vomiting, patient able to bend at knees and spinal receding Anesthetic complications: no    Last Vitals:  Vitals:   11/25/17 1000 11/25/17 1014  BP: 113/77 (!) 115/98  Pulse: 90 97  Resp: 17 16  Temp:  36.7 C  SpO2: 98% 99%    Last Pain:  Vitals:   11/25/17 1014  TempSrc: Oral  PainSc:    Pain Goal: Patients Stated Pain Goal: 3 (11/25/17 1000)               Deneisha Dade A.

## 2017-11-25 NOTE — Progress Notes (Signed)
[name] was referred for history of depression/anxiety. * Referral screened out by Clinical Social Worker because none of the following criteria appear to apply: ~ History of anxiety/depression during this pregnancy, or of post-partum depression. ~ Diagnosis of anxiety and/or depression within last 3 years OR * [name]'s symptoms currently being treated with medication and/or therapy. Please contact the Clinical Social Worker if needs arise, by [name] request, or if [name] scores greater than 9/yes to question 10 on Edinburgh Postpartum Depression Screen.  Tamkia Temples Boyd-Gilyard, MSW, LCSW Clinical Social Work [phone] 

## 2017-11-25 NOTE — Addendum Note (Signed)
Addendum  created 11/25/17 1546 by Algis GreenhouseBurger, Sevin Farone A, CRNA   Sign clinical note

## 2017-11-25 NOTE — Progress Notes (Signed)
Patient 10hrs post-op, progressing well. She ambulated in halls with company of RN, tolerated well. Newman PiesWiggins, Keoni Havey T, RN

## 2017-11-25 NOTE — Transfer of Care (Signed)
Immediate Anesthesia Transfer of Care Note  Patient: Bethany CowmanKristin P Perkins  Procedure(s) Performed: CESAREAN SECTION (N/A )  Patient Location: PACU  Anesthesia Type:Spinal  Level of Consciousness: awake, alert  and oriented  Airway & Oxygen Therapy: Patient Spontanous Breathing  Post-op Assessment: Report given to RN and Post -op Vital signs reviewed and stable  Post vital signs: Reviewed and stable  Last Vitals:  Vitals Value Taken Time  BP 99/56 11/25/2017  8:25 AM  Temp    Pulse 110 11/25/2017  8:27 AM  Resp 18 11/25/2017  8:27 AM  SpO2 98 % 11/25/2017  8:27 AM  Vitals shown include unvalidated device data.  Last Pain:  Vitals:   11/25/17 0549  TempSrc:   PainSc: 0-No pain         Complications: No apparent anesthesia complications

## 2017-11-25 NOTE — Anesthesia Postprocedure Evaluation (Signed)
Anesthesia Post Note  Patient: Bethany CowmanKristin P Haris  Procedure(s) Performed: CESAREAN SECTION (N/A )     Patient location during evaluation: Mother Baby Anesthesia Type: Spinal Level of consciousness: awake Pain management: satisfactory to patient Vital Signs Assessment: post-procedure vital signs reviewed and stable Respiratory status: spontaneous breathing Cardiovascular status: stable Anesthetic complications: no    Last Vitals:  Vitals:   11/25/17 1222 11/25/17 1315  BP: (!) 108/57 (!) 107/57  Pulse: 64 (!) 58  Resp: 16 16  Temp: 36.7 C 37 C  SpO2:  98%    Last Pain:  Vitals:   11/25/17 1315  TempSrc: Oral  PainSc: 0-No pain   Pain Goal: Patients Stated Pain Goal: 3 (11/25/17 1000)               Cephus ShellingBURGER,Dimonique Bourdeau

## 2017-11-25 NOTE — Anesthesia Procedure Notes (Signed)
Spinal  Patient location during procedure: OR Start time: 11/25/2017 7:31 AM Staffing Anesthesiologist: Mal AmabileFoster, Colten Desroches, MD Performed: anesthesiologist  Preanesthetic Checklist Completed: patient identified, site marked, surgical consent, pre-op evaluation, timeout performed, IV checked, risks and benefits discussed and monitors and equipment checked Spinal Block Patient position: sitting Prep: site prepped and draped and DuraPrep Patient monitoring: heart rate, cardiac monitor, continuous pulse ox and blood pressure Approach: midline Location: L3-4 Injection technique: single-shot Needle Needle type: Sprotte  Needle gauge: 24 G Needle length: 9 cm Needle insertion depth: 6 cm Assessment Sensory level: T4 Additional Notes Patient tolerated procedure well. Adequate sensory level.

## 2017-11-26 ENCOUNTER — Encounter (HOSPITAL_COMMUNITY): Payer: Self-pay | Admitting: Obstetrics and Gynecology

## 2017-11-26 LAB — CBC
HEMATOCRIT: 31.5 % — AB (ref 36.0–46.0)
HEMOGLOBIN: 10.1 g/dL — AB (ref 12.0–15.0)
MCH: 27 pg (ref 26.0–34.0)
MCHC: 32.1 g/dL (ref 30.0–36.0)
MCV: 84.2 fL (ref 78.0–100.0)
Platelets: 227 10*3/uL (ref 150–400)
RBC: 3.74 MIL/uL — ABNORMAL LOW (ref 3.87–5.11)
RDW: 15.6 % — AB (ref 11.5–15.5)
WBC: 12.1 10*3/uL — AB (ref 4.0–10.5)

## 2017-11-26 LAB — BIRTH TISSUE RECOVERY COLLECTION (PLACENTA DONATION)

## 2017-11-26 NOTE — Lactation Note (Addendum)
This note was copied from a baby's chart. Lactation Consultation Note  Patient Name: Bethany Wilmon ArmsKristin Villalpando WUJWJ'XToday's Date: 11/26/2017 Reason for consult: Initial assessment;Nipple pain/trauma   Initial assessment with mom of 25 hour old infant. Infant with 8 BF for 15-45 minutes, EBM x 1cc via curved tip syringe, 5 voids and 8 stools in first 24 hours of life. Infant weight 9 pounds 8.6 ounces with 1% weight loss since birth. LATCH scores 6-8.   Parents report they had difficulty with BF their son. They report infant was supplemented after going home due to weight loss. They aer concerned this infant has lost weight. Reviewed with parents that infant weight loss is WNL for day of age. Discussed that infant with great amount of feedings and great output. Mom was concerned infant was sleepy at the breast.   Infant was laying beside mom and was not feeding. Mom has # 20 NS in place that was started last night per parents. Mom with soft compressible breasts and areola with flat nipples at rest that everts with stimulation. Right areola area has slight edema.  Nipples are bruised upon exam, tissue is intact.   Infant was latched to the left breast in the football hold using the teacup hold, infant lathed easily with rhythmic suckling and gulping noted at intervals. infant self detached after 20 minutes and nipple was noted to be rounded. Mom denied pain with feeding. Parents were shown how to position infant and support through feeding. They were shown how to stimulate infant and massage/compress breast with feeding.   Infant burped and diaper changed and dad assisted mom with teacup hold to latch to the right breast in the cross cradle hold, infant with rhythmic suckles and intermittent swallows. She did need some stimulation to maintain suckling.   Infant was then relacthed to the left breast in the cross cradle hold, infant had more difficulty maintaining latch in the cross cradle hold, so put in the  football hold. Infant was hanging out at the breast and not actively feeding. Infant was removed from breast and dad holding STS.   Mom would like to pump, pump was previously set up. Reviewed assembling, disassembling and cleaning of pump parts. Enc mom to pump post BF for 15 minutes on Initiate setting and all EBM to be fed to infant. Mom and I worked on hand expression and 1 cc obtained and was fed to infant via curved tip syringe.   Enc mom to feed infant STS 8-12 x in 24 hours at first feeding cues, offering both breasts with each feeding. Dad reports formula has been mentioned to them, discussed that there is no medical indication at this time for infant to receive formula.   Mom was pumping when LC left room. Mom has shells in the room, enc mom to apply shells when she gets her bra on. Mom has # 20 and # 24 NS in the room to use as needed. I feel infant would need a # 24, shared with parents. Mom is aware of how to apply and clean.   BF Resources handout and LC Brochure given, mom informed of IP/OP Services, BF Support Groups and LC phone #. Parents to call out for feeding assistance as needed.   [place]    Maternal Data Formula Feeding for Exclusion: No Has patient been taught Hand Expression?: Yes Does the patient have breastfeeding experience prior to this delivery?: Yes  Feeding Feeding Type: Breast Fed Length of feed:  45 min  LATCH Score Latch: Grasps breast easily, tongue down, lips flanged, rhythmical sucking.  Audible Swallowing: Spontaneous and intermittent  Type of Nipple: Flat(everts with stimulation)  Comfort (Breast/Nipple): Filling, red/small blisters or bruises, mild/mod discomfort  Hold (Positioning): Assistance needed to correctly position infant at breast and maintain latch.  LATCH Score: 7  Interventions Interventions: Breast feeding basics reviewed;Support pillows;Assisted with latch;Position options;Skin to skin;Expressed milk;Breast  massage;Coconut oil;Hand express;Breast compression;Pre-pump if needed;Shells;DEBP;Hand pump;Adjust position  Lactation Tools Discussed/Used Tools: Shells;Pump;Coconut oil Shell Type: Inverted Breast pump type: Double-Electric Breast Pump;Manual WIC Program: No Pump Review: Setup, frequency, and cleaning;Milk Storage Initiated by:: Bedside RN, Enc mom to pump post BF for 15 minutes   Consult Status Consult Status: Follow-up Date: 11/27/17 Follow-up type: In-patient    Silas Flood Kaeli Nichelson 11/26/2017, 10:22 AM

## 2017-11-26 NOTE — Progress Notes (Signed)
CSW received and acknowledges consult for EDPS of 9.  Consult screened out due to 9 on EDPS does not warrant a CSW consult.  [name] whom scores are greater than 9/yes to question 10 on Edinburgh Postpartum Depression Screen warrants a CSW consult.   Dejuan Elman Boyd-Gilyard, MSW, LCSW Clinical Social Work [phone]  

## 2017-11-26 NOTE — Progress Notes (Signed)
Subjective: Postpartum Day 1: Cesarean Delivery Patient reports tolerating PO.    Objective: Vital signs in last 24 hours: Temp:  [97.6 F (36.4 C)-98.6 F (37 C)] 98.5 F (36.9 C) (04/03 0105) Pulse Rate:  [58-117] 69 (04/03 0540) Resp:  [15-21] 18 (04/03 0540) BP: (91-118)/(43-98) 108/60 (04/03 0540) SpO2:  [97 %-99 %] 97 % (04/03 0540)  Physical Exam:  General: alert Lochia: appropriate Uterine Fundus: firm Incision: healing well DVT Evaluation: No evidence of DVT seen on physical exam.  Recent Labs    11/25/17 0534 11/26/17 0516  HGB 12.0 10.1*  HCT 37.0 31.5*    Assessment/Plan: Status post Cesarean section. Doing well postoperatively.  Continue current care.  Bethany Perkins Bethany Perkins Bethany Perkins 11/26/2017, 7:42 AM

## 2017-11-27 ENCOUNTER — Encounter (HOSPITAL_COMMUNITY): Payer: Self-pay | Admitting: *Deleted

## 2017-11-27 MED ORDER — OXYCODONE-ACETAMINOPHEN 5-325 MG PO TABS: 1.0000 | ORAL_TABLET | ORAL | 0 refills | Status: DC | PRN

## 2017-11-27 MED ORDER — IBUPROFEN 600 MG PO TABS: 600.0000 mg | ORAL_TABLET | Freq: Four times a day (QID) | ORAL | 0 refills | Status: DC

## 2017-11-27 NOTE — Discharge Instructions (Signed)
Call MD for T>100.4, heavy vaginal bleeding, severe abdominal pain, intractable nausea and/or vomiting, or respiratory distress.  Call office to schedule postop incision check in 1-2 weeks.  Pelvic rest x 6 weeks.  No driving while taking narcotics.   °

## 2017-11-27 NOTE — Discharge Summary (Signed)
Obstetric Discharge Summary  Reason for Admission: cesarean section Prenatal Procedures: none Intrapartum Procedures: cesarean: low cervical, transverse Postpartum Procedures: none Complications-Operative and Postpartum: none Hemoglobin  Date Value Ref Range Status  11/26/2017 10.1 (L) 12.0 - 15.0 g/dL Final   HCT  Date Value Ref Range Status  11/26/2017 31.5 (L) 36.0 - 46.0 % Final    Physical Exam:  General: alert, cooperative and appears stated age 22Lochia: appropriate Uterine Fundus: firm Incision: healing well, no significant drainage, no dehiscence DVT Evaluation: No evidence of DVT seen on physical exam. Negative Homan's sign. No cords or calf tenderness.  Discharge Diagnoses: Term Pregnancy-delivered  Discharge Information: Date: 11/27/2017 Activity: pelvic rest Diet: routine Medications: PNV, Ibuprofen and Percocet Condition: stable Instructions: refer to practice specific booklet Discharge to: home   Newborn Data: Live born female  Birth Weight: 9 lb 10.1 oz (4369 g) APGAR: 8, 9  Newborn Delivery   Birth date/time:  11/25/2017 07:50:00 Delivery type:  C-Section, Low Transverse C-section categorization:  Repeat     Home with mother.  Bethany Perkins 11/27/2017, 9:51 AM

## 2017-12-03 ENCOUNTER — Inpatient Hospital Stay (HOSPITAL_COMMUNITY): Admission: RE | Admit: 2017-12-03 | Payer: Federal, State, Local not specified - PPO | Source: Ambulatory Visit

## 2018-01-02 DIAGNOSIS — Z1389 Encounter for screening for other disorder: Secondary | ICD-10-CM | POA: Diagnosis not present

## 2018-01-21 ENCOUNTER — Ambulatory Visit: Payer: 59 | Admitting: Primary Care

## 2018-01-21 ENCOUNTER — Encounter: Payer: Self-pay | Admitting: Primary Care

## 2018-01-21 VITALS — BP 108/68 | HR 95 | Temp 98.2°F | Ht 68.0 in | Wt 176.5 lb

## 2018-01-21 DIAGNOSIS — J019 Acute sinusitis, unspecified: Secondary | ICD-10-CM | POA: Diagnosis not present

## 2018-01-21 DIAGNOSIS — B001 Herpesviral vesicular dermatitis: Secondary | ICD-10-CM | POA: Diagnosis not present

## 2018-01-21 DIAGNOSIS — J0100 Acute maxillary sinusitis, unspecified: Secondary | ICD-10-CM

## 2018-01-21 MED ORDER — AMOXICILLIN-POT CLAVULANATE 875-125 MG PO TABS: 1.0000 | ORAL_TABLET | Freq: Two times a day (BID) | ORAL | 0 refills | Status: DC

## 2018-01-21 MED ORDER — VALACYCLOVIR HCL 1 G PO TABS: 2000.0000 mg | ORAL_TABLET | Freq: Two times a day (BID) | ORAL | 0 refills | Status: DC

## 2018-01-21 NOTE — Assessment & Plan Note (Signed)
Symptoms present for 2 weeks, no improvement. Exam today consistent for bacterial involvement.  Rx for Augmentin course sent to pharmacy. She is breastfeeding, discussed potential side effects to infant.  Fluids, rest, follow up PRN.

## 2018-01-21 NOTE — Assessment & Plan Note (Signed)
Endorses prior history, no improvement with OTC products. Rx for Valtrex course sent to pharmacy. Discussed potential side effects with breast feeding.

## 2018-01-21 NOTE — Progress Notes (Signed)
Subjective:    Patient ID: Bethany Perkins, female    DOB: [birth year], 27 y.o.   MRN: [id number]  HPI  Bethany Perkins is a 27 year old female with a history asthma, sinusitis who presents today with a chief complaint of sinus pressure.  She also reports sore throat, eye drainage, headache, cough, chest congestion, nasal congestion. Her symptoms began two weeks ago. She's expelling thick, green mucous from her nasal cavity, also coughing up green sputum. She's tried taking Mucinex, Dayquil/Nyquil without improvement in symptoms. She has been taking Tylenol for headaches. She denies fevers, chills.   She has a history of cold sores and has started to notice one appearing to her right lower lip. She's been treated with Valtrex in the past with relief. She's tried OTC products for cold sores without improvement.   Review of Systems  Constitutional: Positive for fatigue. Negative for fever.  HENT: Positive for congestion, sinus pressure, sinus pain and sore throat. Negative for ear pain.   Respiratory: Positive for cough. Negative for shortness of breath and wheezing.   Allergic/Immunologic: Positive for environmental allergies.       Past Medical History:  Diagnosis Date  . Allergy    allergic rhinitis seasonal  . Anxiety    in high school  . Asthma    mild  . Dysmenorrhea   . Fx wrist 06/2003     Social History   Socioeconomic History  . Marital status: Married    Spouse name: Not on file  . Number of children: Not on file  . Years of education: Not on file  . Highest education level: Not on file  Occupational History  . Not on file  Social Needs  . Financial resource strain: Not on file  . Food insecurity:    Worry: Not on file    Inability: Not on file  . Transportation needs:    Medical: Not on file    Non-medical: Not on file  Tobacco Use  . Smoking status: Never Smoker  . Smokeless tobacco: Never Used  Substance and Sexual Activity  . Alcohol use: No  .  Drug use: No  . Sexual activity: Yes    Birth control/protection: Condom  Lifestyle  . Physical activity:    Days per week: Not on file    Minutes per session: Not on file  . Stress: Not on file  Relationships  . Social connections:    Talks on phone: Not on file    Gets together: Not on file    Attends religious service: Not on file    Active member of club or organization: Not on file    Attends meetings of clubs or organizations: Not on file    Relationship status: Not on file  . Intimate partner violence:    Fear of current or ex partner: Not on file    Emotionally abused: Not on file    Physically abused: Not on file    Forced sexual activity: Not on file  Other Topics Concern  . Not on file  Social History Narrative   Finished school for Engineer, site.    Past Surgical History:  Procedure Laterality Date  . CESAREAN SECTION N/A 04/07/2015   Procedure: CESAREAN SECTION;  Surgeon: Zelphia Cairo, MD;  Location: [facility];  Service: Obstetrics;  Laterality: N/A;  . CESAREAN SECTION N/A 11/25/2017   Procedure: CESAREAN SECTION;  Surgeon: Zelphia Cairo, MD;  Location: [facility];  Service: Obstetrics;  Laterality: N/A;  Repeat edc 11/27/17 nkda need RNFA - HK to RNFA  . TONSILLECTOMY    . WISDOM TOOTH EXTRACTION      Family History  Problem Relation Age of Onset  . Cancer Maternal Grandfather        unknown tumor  . Heart disease Maternal Grandfather   . Hypertension Maternal Grandfather   . Diabetes Maternal Grandfather   . Cancer Paternal Grandmother        breast  . Hypertension Paternal Grandmother   . Dementia Paternal Grandmother   . Diabetes Paternal Grandmother     No Known Allergies  Current Outpatient Medications on File Prior to Visit  Medication Sig Dispense Refill  . acetaminophen (TYLENOL) 500 MG tablet Take 1,000 mg by mouth every 6 (six) hours as needed for moderate pain or headache.    Marland Kitchen CAMILA 0.35 MG tablet      No current  facility-administered medications on file prior to visit.     BP 108/68   Pulse 95   Temp 98.2 F (36.8 C) (Oral)   Ht  (1.727 m)   Wt 176 lb 8 oz (80.1 kg)   SpO2 98%   Breastfeeding? Yes   BMI 26.84 kg/m    Objective:   Physical Exam  Constitutional: She appears well-nourished.  HENT:  Right Ear: Tympanic membrane and ear canal normal.  Left Ear: Tympanic membrane and ear canal normal.  Nose: Mucosal edema present. Right sinus exhibits maxillary sinus tenderness. Left sinus exhibits maxillary sinus tenderness.  No obvious herpes labialis noted  Neck: Neck supple.  Cardiovascular: Normal rate and regular rhythm.  Respiratory: Effort normal and breath sounds normal.  Skin: Skin is warm and dry.           Assessment & Plan:

## 2018-01-21 NOTE — Patient Instructions (Signed)
Start Augmentin antibiotics for the infection Take 1 tablet by mouth twice daily for 10 days.  Start Valtrex 1000 mg tablets for cold sore. Take 2 tablets twice daily for one day.  Make sure to drink plenty of water to thin mucous. Okay to continue Mucinex.  It was a pleasure meeting you!

## 2020-04-07 ENCOUNTER — Ambulatory Visit: Payer: 59 | Admitting: Family Medicine

## 2020-05-02 ENCOUNTER — Telehealth: Payer: Self-pay | Admitting: Family Medicine

## 2020-05-02 DIAGNOSIS — Z0000 Encounter for general adult medical examination without abnormal findings: Secondary | ICD-10-CM | POA: Insufficient documentation

## 2020-05-02 NOTE — Telephone Encounter (Signed)
-----   Message from Aquilla Solian, RT sent at 05/02/2020  9:47 AM EDT ----- Regarding: Lab Orders for Wednesday 9.8.2021 Please place lab orders for Wednesday 9.8.2021, office visit for physical on Wednesday 9.15.2021 Thank you, Jones Bales RT(R)

## 2020-05-03 ENCOUNTER — Other Ambulatory Visit (INDEPENDENT_AMBULATORY_CARE_PROVIDER_SITE_OTHER): Payer: Commercial Managed Care - PPO

## 2020-05-03 ENCOUNTER — Other Ambulatory Visit: Payer: Self-pay

## 2020-05-03 DIAGNOSIS — Z0000 Encounter for general adult medical examination without abnormal findings: Secondary | ICD-10-CM | POA: Diagnosis not present

## 2020-05-03 LAB — LDL CHOLESTEROL, DIRECT: Direct LDL: 100 mg/dL

## 2020-05-03 LAB — CBC WITH DIFFERENTIAL/PLATELET
Basophils Absolute: 0.1 10*3/uL (ref 0.0–0.1)
Basophils Relative: 1.2 % (ref 0.0–3.0)
Eosinophils Absolute: 0.2 10*3/uL (ref 0.0–0.7)
Eosinophils Relative: 2.4 % (ref 0.0–5.0)
HCT: 40.4 % (ref 36.0–46.0)
Hemoglobin: 13.7 g/dL (ref 12.0–15.0)
Lymphocytes Relative: 28.9 % (ref 12.0–46.0)
Lymphs Abs: 2.8 10*3/uL (ref 0.7–4.0)
MCHC: 34 g/dL (ref 30.0–36.0)
MCV: 87.9 fl (ref 78.0–100.0)
Monocytes Absolute: 0.6 10*3/uL (ref 0.1–1.0)
Monocytes Relative: 6.2 % (ref 3.0–12.0)
Neutro Abs: 5.9 10*3/uL (ref 1.4–7.7)
Neutrophils Relative %: 61.3 % (ref 43.0–77.0)
Platelets: 317 10*3/uL (ref 150.0–400.0)
RBC: 4.59 Mil/uL (ref 3.87–5.11)
RDW: 13.2 % (ref 11.5–15.5)
WBC: 9.6 10*3/uL (ref 4.0–10.5)

## 2020-05-03 LAB — COMPREHENSIVE METABOLIC PANEL
ALT: 11 U/L (ref 0–35)
AST: 12 U/L (ref 0–37)
Albumin: 4.1 g/dL (ref 3.5–5.2)
Alkaline Phosphatase: 56 U/L (ref 39–117)
BUN: 12 mg/dL (ref 6–23)
CO2: 26 mEq/L (ref 19–32)
Calcium: 9.3 mg/dL (ref 8.4–10.5)
Chloride: 101 mEq/L (ref 96–112)
Creatinine, Ser: 0.78 mg/dL (ref 0.40–1.20)
GFR: 87.59 mL/min (ref >60.00)
Glucose, Bld: 94 mg/dL (ref 70–99)
Potassium: 4.7 mEq/L (ref 3.5–5.1)
Sodium: 135 mEq/L (ref 135–145)
Total Bilirubin: 0.4 mg/dL (ref 0.2–1.2)
Total Protein: 6.6 g/dL (ref 6.0–8.3)

## 2020-05-03 LAB — LIPID PANEL
Cholesterol: 223 mg/dL — ABNORMAL HIGH (ref 0–200)
HDL: 38.5 mg/dL — ABNORMAL LOW (ref >39.00)
Total CHOL/HDL Ratio: 6
Triglycerides: 525 mg/dL — ABNORMAL HIGH (ref 0.0–149.0)

## 2020-05-03 LAB — TSH: TSH: 2.39 u[IU]/mL (ref 0.35–4.50)

## 2020-05-10 ENCOUNTER — Encounter: Payer: Self-pay | Admitting: Family Medicine

## 2020-05-10 ENCOUNTER — Ambulatory Visit (INDEPENDENT_AMBULATORY_CARE_PROVIDER_SITE_OTHER): Payer: Commercial Managed Care - PPO | Admitting: Family Medicine

## 2020-05-10 ENCOUNTER — Other Ambulatory Visit: Payer: Self-pay

## 2020-05-10 VITALS — BP 128/64 | HR 93 | Temp 97.5°F | Ht 67.5 in | Wt 183.0 lb

## 2020-05-10 DIAGNOSIS — Z0000 Encounter for general adult medical examination without abnormal findings: Secondary | ICD-10-CM

## 2020-05-10 DIAGNOSIS — B001 Herpesviral vesicular dermatitis: Secondary | ICD-10-CM | POA: Diagnosis not present

## 2020-05-10 DIAGNOSIS — Z23 Encounter for immunization: Secondary | ICD-10-CM | POA: Diagnosis not present

## 2020-05-10 DIAGNOSIS — E781 Pure hyperglyceridemia: Secondary | ICD-10-CM | POA: Insufficient documentation

## 2020-05-10 MED ORDER — VALACYCLOVIR HCL 1 G PO TABS: 2000.0000 mg | ORAL_TABLET | Freq: Two times a day (BID) | ORAL | 3 refills | Status: DC

## 2020-05-10 NOTE — Assessment & Plan Note (Signed)
Reviewed health habits including diet and exercise and skin cancer prevention Reviewed appropriate screening tests for age  Also reviewed health mt list, fam hx and immunization status , as well as social and family history   See HPI Labs reviewed -disc plan for triglycerides Flu shot today  Declines covid imm- questions invited and website given/ enc to re consider  Sent for last pap report from gyn Doing well on current OC

## 2020-05-10 NOTE — Progress Notes (Signed)
Subjective:    Patient ID: Bethany Perkins, female    DOB: 03/09/1991, 29 y.o.   MRN: [id number]  This visit occurred during the SARS-CoV-2 public health emergency.  Safety protocols were in place, including screening questions prior to the visit, additional usage of staff PPE, and extensive cleaning of exam room while observing appropriate contact time as indicated for disinfecting solutions.    HPI  Here for health maintenance exam and to review chronic medical problems    Wt Readings from Last 3 Encounters:  05/10/20 183 lb (83 kg)  01/21/18 176 lb 8 oz (80.1 kg)  11/25/17 197 lb 15.6 oz (89.8 kg)   28.24 kg/m   Married with 2 kids  Is at home  She takes fair care of herself   Does not exercise- hard to stay motivated  Has a place to walk -new neighborhood /just moved  [employer] the [employer] better when she does   Flu shot-today  Tdap 8/12 Had HPV vaccines  covid status - she has not had covid or vaccine  Not thinking about vaccine    She sees gyn- went last year oct / will be planning a visit  Menses- just started oc regularly in past 3 month  Last period was light  W/o the OC- has pms and heavy /crampy periods  Takes OC 1/20   BP Readings from Last 3 Encounters:  05/10/20 128/64  01/21/18 108/68  11/27/17 120/72   Pulse Readings from Last 3 Encounters:  05/10/20 93  01/21/18 95  11/27/17 77    Cholesterol  Lab Results  Component Value Date   CHOL 223 (H) 05/03/2020   Lab Results  Component Value Date   HDL 38.50 (L) 05/03/2020   No results found for: [facility] Lab Results  Component Value Date   TRIG (H) 05/03/2020    525.0 Triglyceride is over 400; calculations on Lipids are invalid.   Lab Results  Component Value Date   CHOLHDL 6 05/03/2020   Lab Results  Component Value Date   LDLDIRECT 100.0 05/03/2020   She was fasting for her labs  Never checked before   Eating habits are not great- does not eat until late in  the day  The past year - eating out a lot more / fair amt of fast food    GF and mother have DM2 Mother may have high chol  Other labs Results for orders placed or performed in visit on 05/03/20  TSH  Result Value Ref Range   TSH 2.39 0.35 - 4.50 uIU/mL  Lipid panel  Result Value Ref Range   Cholesterol 223 (H) 0 - 200 mg/dL   Triglycerides (H) 0 - 149 mg/dL    881.1 Triglyceride is over 400; calculations on Lipids are invalid.   HDL 38.50 (L) >39.00 mg/dL   Total CHOL/HDL Ratio 6   CBC with Differential/Platelet  Result Value Ref Range   WBC 9.6 4.0 - 10.5 K/uL   RBC 4.59 3.87 - 5.11 Mil/uL   Hemoglobin 13.7 12.0 - 15.0 g/dL   HCT 03.1 36 - 46 %   MCV 87.9 78.0 - 100.0 fl   MCHC 34.0 30.0 - 36.0 g/dL   RDW 59.4 58.5 - 92.9 %   Platelets 317.0 150 - 400 K/uL   Neutrophils Relative % 61.3 43 - 77 %   Lymphocytes Relative 28.9 12 - 46 %   Monocytes Relative 6.2 3 - 12 %   Eosinophils Relative  2.4 0 - 5 %   Basophils Relative 1.2 0 - 3 %   Neutro Abs 5.9 1.4 - 7.7 K/uL   Lymphs Abs 2.8 0.7 - 4.0 K/uL   Monocytes Absolute 0.6 0 - 1 K/uL   Eosinophils Absolute 0.2 0 - 0 K/uL   Basophils Absolute 0.1 0 - 0 K/uL  Comprehensive metabolic panel  Result Value Ref Range   Sodium 135 135 - 145 mEq/L   Potassium 4.7 3.5 - 5.1 mEq/L   Chloride 101 96 - 112 mEq/L   CO2 26 19 - 32 mEq/L   Glucose, Bld 94 70 - 99 mg/dL   BUN 12 6 - 23 mg/dL   Creatinine, Ser 4.25 0.40 - 1.20 mg/dL   Total Bilirubin 0.4 0.2 - 1.2 mg/dL   Alkaline Phosphatase 56 39 - 117 U/L   AST 12 0 - 37 U/L   ALT 11 0 - 35 U/L   Total Protein 6.6 6.0 - 8.3 g/dL   Albumin 4.1 3.5 - 5.2 g/dL   GFR 95.63 >87.56 mL/min   Calcium 9.3 8.4 - 10.5 mg/dL  LDL cholesterol, direct  Result Value Ref Range   Direct LDL 100.0 mg/dL    Patient Active Problem List   Diagnosis Date Noted  . Hypertriglyceridemia 05/10/2020  . Routine general medical examination at a health care facility 05/02/2020  . Herpes labialis  01/21/2018  . S/P cesarean section 04/07/2015  . PROM (premature rupture of membranes) 04/06/2015  . Primigravida 08/05/2014  . Encounter for routine gynecological examination 03/23/2013  . ANXIETY DISORDER 04/24/2010  . DYSMENORRHEA 08/11/2007  . Asthma 03/18/2007   Past Medical History:  Diagnosis Date  . Allergy    allergic rhinitis seasonal  . Anxiety    in high school  . Asthma    mild  . Dysmenorrhea   . Fx wrist 06/2003   Past Surgical History:  Procedure Laterality Date  . CESAREAN SECTION N/A 04/07/2015   Procedure: CESAREAN SECTION;  Surgeon: Zelphia Cairo, MD;  Location: [facility];  Service: Obstetrics;  Laterality: N/A;  . CESAREAN SECTION N/A 11/25/2017   Procedure: CESAREAN SECTION;  Surgeon: Zelphia Cairo, MD;  Location: [facility]IRTHING SU[facility];  Service: Obstetrics;  Laterality: N/A;  Repeat edc 11/27/17 nkda need RNFA - HK to RNFA  . TONSILLECTOMY    . WISDOM TOOTH EXTRACTION     Social History   Tobacco Use  . Smoking status: Never Smoker  . Smokeless tobacco: Never Used  Substance Use Topics  . Alcohol use: No  . Drug use: No   Family History  Problem Relation Age of Onset  . Cancer Maternal Grandfather        unknown tumor  . Heart disease Maternal Grandfather   . Hypertension Maternal Grandfather   . Diabetes Maternal Grandfather   . Cancer Paternal Grandmother        breast  . Hypertension Paternal Grandmother   . Dementia Paternal Grandmother   . Diabetes Paternal Grandmother    No Known Allergies Current Outpatient Medications on File Prior to Visit  Medication Sig Dispense Refill  . acetaminophen (TYLENOL) 500 MG tablet Take 1,000 mg by mouth every 6 (six) hours as needed for moderate pain or headache.    . Norethindrone Acetate-Ethinyl Estrad-FE (BLISOVI 24 FE) 1-20 MG-MCG(24) tablet Take 1 tablet by mouth daily.     No current facility-administered medications on file prior to visit.    Review of Systems  Constitutional: Negative for  activity change,  appetite change, fatigue, fever and unexpected weight change.       Some fatigue from hectic schedule- 2 small children  HENT: Negative for congestion, ear pain, rhinorrhea, sinus pressure and sore throat.   Eyes: Negative for pain, redness and visual disturbance.  Respiratory: Negative for cough, shortness of breath and wheezing.   Cardiovascular: Negative for chest pain and palpitations.  Gastrointestinal: Negative for abdominal pain, blood in stool, constipation and diarrhea.  Endocrine: Negative for polydipsia and polyuria.  Genitourinary: Negative for dysuria, frequency and urgency.  Musculoskeletal: Negative for arthralgias, back pain and myalgias.  Skin: Negative for pallor and rash.  Allergic/Immunologic: Negative for environmental allergies.  Neurological: Negative for dizziness, syncope and headaches.  Hematological: Negative for adenopathy. Does not bruise/bleed easily.  Psychiatric/Behavioral: Negative for decreased concentration and dysphoric mood. The patient is not nervous/anxious.        Objective:   Physical Exam Constitutional:      General: She is not in acute distress.    Appearance: Normal appearance. She is well-developed and normal weight. She is not ill-appearing or diaphoretic.  HENT:     Head: Normocephalic and atraumatic.     Right Ear: Tympanic membrane, ear canal and external ear normal.     Left Ear: Tympanic membrane, ear canal and external ear normal.     Nose: Nose normal. No congestion.     Mouth/Throat:     Mouth: Mucous membranes are moist.     Pharynx: Oropharynx is clear. No posterior oropharyngeal erythema.  Eyes:     General: No scleral icterus.    Extraocular Movements: Extraocular movements intact.     Conjunctiva/sclera: Conjunctivae normal.     Pupils: Pupils are equal, round, and reactive to light.  Neck:     Thyroid: No thyromegaly.     Vascular: No carotid bruit or JVD.  Cardiovascular:     Rate and Rhythm: Normal  rate and regular rhythm.     Pulses: Normal pulses.     Heart sounds: Normal heart sounds. No gallop.   Pulmonary:     Effort: Pulmonary effort is normal. No respiratory distress.     Breath sounds: Normal breath sounds. No wheezing.     Comments: Good air exch Chest:     Chest wall: No tenderness.  Abdominal:     General: Bowel sounds are normal. There is no distension or abdominal bruit.     Palpations: Abdomen is soft. There is no mass.     Tenderness: There is no abdominal tenderness.     Hernia: No hernia is present.  Genitourinary:    Comments: Breast and pelvic exam done by gyn   Musculoskeletal:        General: No tenderness. Normal range of motion.     Cervical back: Normal range of motion and neck supple. No rigidity. No muscular tenderness.     Right lower leg: No edema.     Left lower leg: No edema.  Lymphadenopathy:     Cervical: No cervical adenopathy.  Skin:    General: Skin is warm and dry.     Coloration: Skin is not pale.     Findings: No erythema or rash.     Comments: Solar lentigines diffusely Some brown nevi on trunk (per pt stable)  Neurological:     Mental Status: She is alert. Mental status is at baseline.     Cranial Nerves: No cranial nerve deficit.     Motor: No abnormal muscle tone.  Coordination: Coordination normal.     Gait: Gait normal.     Deep Tendon Reflexes: Reflexes are normal and symmetric. Reflexes normal.  Psychiatric:        Mood and Affect: Mood normal.        Cognition and Memory: Cognition and memory normal.     Comments: pleasant           Assessment & Plan:   Problem List Items Addressed This Visit      Digestive   Herpes labialis    Refilled valtrex for use (cold sores) prn      Relevant Medications   valACYclovir (VALTREX) 1000 MG tablet     Other   Routine general medical examination at a health care facility - Primary    Reviewed health habits including diet and exercise and skin cancer  prevention Reviewed appropriate screening tests for age  Also reviewed health mt list, fam hx and immunization status , as well as social and family history   See HPI Labs reviewed -disc plan for triglycerides Flu shot today  Declines covid imm- questions invited and website given/ enc to re consider  Sent for last pap report from gyn Doing well on current OC         Relevant Orders   Flu Vaccine QUAD 6+ mos PF IM (Fluarix Quad PF) (Completed)   Hypertriglyceridemia    Trig over 500  May be hereditary but diet is not optimal  Disc this and handout given  Also disc imp of exercise and role of omega 3 Disc goals for lipids and reasons to control them Rev last labs with pt Rev low sat fat diet in detail  Planning re check in 1-2 mo after lifestyle change  Consider fibrate if no imp       Other Visit Diagnoses    Need for influenza vaccination       Relevant Orders   Flu Vaccine QUAD 6+ mos PF IM (Fluarix Quad PF) (Completed)

## 2020-05-10 NOTE — Assessment & Plan Note (Signed)
Trig over 500  May be hereditary but diet is not optimal  Disc this and handout given  Also disc imp of exercise and role of omega 3 Disc goals for lipids and reasons to control them Rev last labs with pt Rev low sat fat diet in detail  Planning re check in 1-2 mo after lifestyle change  Consider fibrate if no imp

## 2020-05-10 NOTE — Assessment & Plan Note (Signed)
Refilled valtrex for use (cold sores) prn

## 2020-05-10 NOTE — Patient Instructions (Addendum)
Eating fish and taking an omega 3 supplement can help raise HDL and lower triglycerides   Avoid red meat/ fried foods/ egg yolks/ fatty breakfast meats/ butter, cheese and high fat dairy/ and shellfish   Let's check cholesterol again in 1-2 months with better diet    Flu shot today  Try to add in more exercise    COVID-19 Vaccine Information can be found at: [employer] For questions related to vaccine distribution or appointments, please email [email or web address] or call [phone].

## 2020-07-12 ENCOUNTER — Other Ambulatory Visit: Payer: Self-pay

## 2020-07-12 ENCOUNTER — Other Ambulatory Visit (INDEPENDENT_AMBULATORY_CARE_PROVIDER_SITE_OTHER): Payer: Commercial Managed Care - PPO

## 2020-07-12 DIAGNOSIS — E781 Pure hyperglyceridemia: Secondary | ICD-10-CM

## 2020-07-12 LAB — LIPID PANEL
Cholesterol: 246 mg/dL — ABNORMAL HIGH (ref 0–200)
HDL: 37.9 mg/dL — ABNORMAL LOW (ref >39.00)
Total CHOL/HDL Ratio: 6
Triglycerides: 414 mg/dL — ABNORMAL HIGH (ref 0.0–149.0)

## 2020-07-12 LAB — LDL CHOLESTEROL, DIRECT: Direct LDL: 128 mg/dL

## 2021-02-13 LAB — OB RESULTS CONSOLE ABO/RH: RH Type: POSITIVE

## 2021-02-13 LAB — OB RESULTS CONSOLE RPR: RPR: NONREACTIVE

## 2021-02-13 LAB — OB RESULTS CONSOLE HEPATITIS B SURFACE ANTIGEN: Hepatitis B Surface Ag: NEGATIVE

## 2021-02-13 LAB — OB RESULTS CONSOLE ANTIBODY SCREEN: Antibody Screen: NEGATIVE

## 2021-02-13 LAB — OB RESULTS CONSOLE RUBELLA ANTIBODY, IGM: Rubella: IMMUNE

## 2021-02-13 LAB — OB RESULTS CONSOLE HIV ANTIBODY (ROUTINE TESTING): HIV: NONREACTIVE

## 2021-02-13 LAB — HEPATITIS C ANTIBODY: HCV Ab: NEGATIVE

## 2021-03-05 LAB — OB RESULTS CONSOLE GC/CHLAMYDIA
Chlamydia: NEGATIVE
Gonorrhea: NEGATIVE

## 2021-08-13 LAB — OB RESULTS CONSOLE GBS: GBS: NEGATIVE

## 2021-08-22 ENCOUNTER — Encounter (HOSPITAL_COMMUNITY): Payer: Self-pay | Admitting: *Deleted

## 2021-08-22 ENCOUNTER — Encounter (HOSPITAL_COMMUNITY): Payer: Self-pay

## 2021-08-22 NOTE — Patient Instructions (Signed)
DHANYA BOGLE  08/22/2021   Your procedure is scheduled on:  08/31/2021  Arrive at 0530 at Entrance C on [address] at [facility]  and [employer]. You are invited to use the FREE valet parking or use the Visitor's parking deck.  Pick up the phone at the desk and dial [phone].  Call this number if you have problems the morning of surgery: [phone]  Remember:   Do not eat food:(After Midnight) Desps de medianoche.  Do not drink clear liquids: (After Midnight) Desps de medianoche.  Take these medicines the morning of surgery with A SIP OF WATER:  none   Do not wear jewelry, make-up or nail polish.  Do not wear lotions, powders, or perfumes. Do not wear deodorant.  Do not shave 48 hours prior to surgery.  Do not bring valuables to the hospital.  [facility] is not   responsible for any belongings or valuables brought to the hospital.  Contacts, dentures or bridgework may not be worn into surgery.  Leave suitcase in the car. After surgery it may be brought to your room.  For patients admitted to the hospital, checkout time is 11:00 AM the day of              discharge.      Please read over the following fact sheets that you were given:     Preparing for Surgery

## 2021-08-24 NOTE — H&P (Signed)
Bethany Perkins is a 30 y.o. female presenting for repeat cesarean section with BTL. Hx two prior CS (one c/b a 9#10 infant). Pregnancy otherwise uncomplicated. Expecting a RR boy.   OB History     Gravida  3   Para  2   Term  2   Preterm      AB      Living  1      SAB      IAB      Ectopic      Multiple  0   Live Births  1          Past Medical History:  Diagnosis Date   Allergy    allergic rhinitis seasonal   Anxiety    in high school   Asthma    mild   Dysmenorrhea    Fx wrist 06/2003   Past Surgical History:  Procedure Laterality Date   CESAREAN SECTION N/A 04/07/2015   Procedure: CESAREAN SECTION;  Surgeon: Zelphia Cairo, MD;  Location: [facility];  Service: Obstetrics;  Laterality: N/A;   CESAREAN SECTION N/A 11/25/2017   Procedure: CESAREAN SECTION;  Surgeon: Zelphia Cairo, MD;  Location: [facility]IRTHING SU[facility];  Service: Obstetrics;  Laterality: N/A;  Repeat edc 11/27/17 nkda need RNFA - HK to RNFA   TONSILLECTOMY     WISDOM TOOTH EXTRACTION     Family History: family history includes Cancer in her maternal grandfather and paternal grandmother; Dementia in her paternal grandmother; Diabetes in her maternal grandfather and paternal grandmother; Heart disease in her maternal grandfather; Hypertension in her maternal grandfather and paternal grandmother. Social History:  reports that she has never smoked. She has never used smokeless tobacco. She reports that she does not drink alcohol and does not use drugs.     Maternal Diabetes: No Genetic Screening: Normal Maternal Ultrasounds/Referrals: Normal Fetal Ultrasounds or other Referrals:  None Maternal Substance Abuse:  No Significant Maternal Medications:  None Significant Maternal Lab Results:  None Other Comments:  None  Review of Systems History   currently breastfeeding. Exam Physical Exam  (from office) NAD, A&O NWOB Abd soft, nondistended, gravid  Prenatal labs: ABO, Rh:  O/Positive/-- (06/21 0000) Antibody: Negative (06/21 0000) Rubella: Immune (06/21 0000) RPR: Nonreactive (06/21 0000)  HBsAg: Negative (06/21 0000)  HIV: Non-reactive (06/21 0000)  GBS: Negative/-- (12/19 0000)   Assessment/Plan: 30 yo presenting for scheduled repeat CS with requested bilateral tubal ligation. Risks discussed including infection, bleeding, damage to surrounding structures, the need for additional procedures including hysterectomy, and the possibility of uterine rupture with neonatal morbidity/mortality, scarring, and abnormal placentation with subsequent pregnancies. We also discussed bilateral tubal ligation in detail. The alternatives to permanent sterilization were reviewed and it was discussed that this is considered permanent. We reviewed the risks in detail including regret, failure and ectopic pregnancy. She understands and agrees to proceed.     Madelaine Etienne Filemon Breton 08/24/2021, 1:13 PM

## 2021-08-29 ENCOUNTER — Other Ambulatory Visit: Payer: Self-pay | Admitting: Obstetrics and Gynecology

## 2021-08-29 ENCOUNTER — Other Ambulatory Visit: Payer: Self-pay

## 2021-08-29 ENCOUNTER — Other Ambulatory Visit (HOSPITAL_COMMUNITY)
Admission: RE | Admit: 2021-08-29 | Discharge: 2021-08-29 | Disposition: A | Discharge status: Home / Self Care | Payer: Commercial Managed Care - PPO | Source: Ambulatory Visit | Attending: Obstetrics and Gynecology | Admitting: Obstetrics and Gynecology

## 2021-08-29 DIAGNOSIS — Z3490 Encounter for supervision of normal pregnancy, unspecified, unspecified trimester: Secondary | ICD-10-CM | POA: Insufficient documentation

## 2021-08-29 LAB — TYPE AND SCREEN
ABO/RH(D): O POS
Antibody Screen: NEGATIVE

## 2021-08-29 LAB — CBC
HCT: 34.5 % — ABNORMAL LOW (ref 36.0–46.0)
Hemoglobin: 11.1 g/dL — ABNORMAL LOW (ref 12.0–15.0)
MCH: 26.9 pg (ref 26.0–34.0)
MCHC: 32.2 g/dL (ref 30.0–36.0)
MCV: 83.5 fL (ref 80.0–100.0)
Platelets: 288 10*3/uL (ref 150–400)
RBC: 4.13 MIL/uL (ref 3.87–5.11)
RDW: 15.2 % (ref 11.5–15.5)
WBC: 11.8 10*3/uL — ABNORMAL HIGH (ref 4.0–10.5)
nRBC: 0 % (ref 0.0–0.2)

## 2021-08-29 LAB — RPR: RPR Ser Ql: NONREACTIVE

## 2021-08-29 NOTE — Anesthesia Preprocedure Evaluation (Addendum)
Anesthesia Evaluation  Patient identified by MRN, date of birth, ID band Patient awake    Reviewed: Allergy & Precautions, NPO status , Patient's Chart, lab work & pertinent test results  Airway Mallampati: II  TM Distance: >3 FB Neck ROM: Full    Dental no notable dental hx. (+) Teeth Intact, Dental Advisory Given   Pulmonary asthma ,    Pulmonary exam normal breath sounds clear to auscultation       Cardiovascular Exercise Tolerance: Good Normal cardiovascular exam Rhythm:Regular Rate:Normal     Neuro/Psych negative neurological ROS     GI/Hepatic negative GI ROS, Neg liver ROS,   Endo/Other  negative endocrine ROS  Renal/GU negative Renal ROS     Musculoskeletal   Abdominal   Peds  Hematology Lab Results      Component                Value               Date                      WBC                      11.8 (H)            08/29/2021                HGB                      11.1 (L)            08/29/2021                HCT                      34.5 (L)            08/29/2021                      PLT                      288                 08/29/2021              Anesthesia Other Findings   Reproductive/Obstetrics (+) Pregnancy                            Anesthesia Physical Anesthesia Plan  ASA: 2  Anesthesia Plan: Spinal   Post-op Pain Management: Minimal or no pain anticipated   Induction:   PONV Risk Score and Plan: Treatment may vary due to age or medical condition  Airway Management Planned: Natural Airway and Simple Face Mask  Additional Equipment:   Intra-op Plan:   Post-operative Plan:   Informed Consent: I have reviewed the patients History and Physical, chart, labs and discussed the procedure including the risks, benefits and alternatives for the proposed anesthesia with the patient or authorized representative who has indicated his/her understanding and  acceptance.     Dental advisory given  Plan Discussed with: CRNA and Anesthesiologist  Anesthesia Plan Comments: ( G3P2 for rpt C/S x3 w BTL under Spinal T&S available)       Anesthesia Quick Evaluation

## 2021-08-30 LAB — SARS CORONAVIRUS 2 (TAT 6-24 HRS): SARS Coronavirus 2: NEGATIVE

## 2021-08-31 ENCOUNTER — Encounter (HOSPITAL_COMMUNITY): Payer: Self-pay | Admitting: Obstetrics and Gynecology

## 2021-08-31 ENCOUNTER — Encounter (HOSPITAL_COMMUNITY)
Admission: RE | Disposition: A | Discharge status: Home / Self Care | Payer: Self-pay | Source: Home / Self Care | Attending: Obstetrics and Gynecology

## 2021-08-31 ENCOUNTER — Inpatient Hospital Stay (HOSPITAL_COMMUNITY): Payer: Commercial Managed Care - PPO | Admitting: Anesthesiology

## 2021-08-31 ENCOUNTER — Inpatient Hospital Stay (HOSPITAL_COMMUNITY)
Admission: RE | Admit: 2021-08-31 | Discharge: 2021-09-02 | DRG: 784 | Disposition: A | Discharge status: Home / Self Care | Payer: Commercial Managed Care - PPO | Attending: Obstetrics and Gynecology | Admitting: Obstetrics and Gynecology

## 2021-08-31 ENCOUNTER — Other Ambulatory Visit: Payer: Self-pay

## 2021-08-31 DIAGNOSIS — Z302 Encounter for sterilization: Secondary | ICD-10-CM

## 2021-08-31 DIAGNOSIS — O34211 Maternal care for low transverse scar from previous cesarean delivery: Secondary | ICD-10-CM | POA: Diagnosis present

## 2021-08-31 DIAGNOSIS — Z3A39 39 weeks gestation of pregnancy: Secondary | ICD-10-CM

## 2021-08-31 DIAGNOSIS — D62 Acute posthemorrhagic anemia: Secondary | ICD-10-CM | POA: Diagnosis not present

## 2021-08-31 DIAGNOSIS — O9081 Anemia of the puerperium: Secondary | ICD-10-CM | POA: Diagnosis not present

## 2021-08-31 DIAGNOSIS — Z3490 Encounter for supervision of normal pregnancy, unspecified, unspecified trimester: Secondary | ICD-10-CM

## 2021-08-31 SURGERY — Surgical Case
Anesthesia: Spinal

## 2021-08-31 MED ORDER — SENNOSIDES-DOCUSATE SODIUM 8.6-50 MG PO TABS
2.0000 | ORAL_TABLET | Freq: Every day | ORAL | Status: DC
  Administered 2021-09-01 – 2021-09-02 (×2): 2 via ORAL
  Filled 2021-08-31 (×2): qty 2

## 2021-08-31 MED ORDER — SIMETHICONE 80 MG PO CHEW
80.0000 mg | CHEWABLE_TABLET | Freq: Three times a day (TID) | ORAL | Status: DC
  Administered 2021-08-31 – 2021-09-02 (×5): 80 mg via ORAL
  Filled 2021-08-31 (×5): qty 1

## 2021-08-31 MED ORDER — MEPERIDINE HCL 25 MG/ML IJ SOLN: 6.2500 mg | INTRAMUSCULAR | Status: DC | PRN

## 2021-08-31 MED ORDER — DEXAMETHASONE SODIUM PHOSPHATE 4 MG/ML IJ SOLN
INTRAMUSCULAR | Status: AC
  Filled 2021-08-31: qty 2

## 2021-08-31 MED ORDER — OXYCODONE HCL 5 MG PO TABS: 5.0000 mg | ORAL_TABLET | Freq: Once | ORAL | Status: DC | PRN

## 2021-08-31 MED ORDER — PHENYLEPHRINE HCL-NACL 20-0.9 MG/250ML-% IV SOLN
INTRAVENOUS | Status: AC
  Filled 2021-08-31: qty 250

## 2021-08-31 MED ORDER — SCOPOLAMINE 1 MG/3DAYS TD PT72: 1.0000 | MEDICATED_PATCH | Freq: Once | TRANSDERMAL | Status: DC

## 2021-08-31 MED ORDER — FENTANYL CITRATE (PF) 100 MCG/2ML IJ SOLN
INTRAMUSCULAR | Status: AC
  Filled 2021-08-31: qty 2

## 2021-08-31 MED ORDER — ONDANSETRON HCL 4 MG/2ML IJ SOLN
INTRAMUSCULAR | Status: AC
  Filled 2021-08-31: qty 2

## 2021-08-31 MED ORDER — MENTHOL 3 MG MT LOZG: 1.0000 | LOZENGE | OROMUCOSAL | Status: DC | PRN

## 2021-08-31 MED ORDER — LACTATED RINGERS IV SOLN: INTRAVENOUS | Status: DC

## 2021-08-31 MED ORDER — CEFAZOLIN SODIUM-DEXTROSE 2-4 GM/100ML-% IV SOLN: 2.0000 g | INTRAVENOUS | Status: DC

## 2021-08-31 MED ORDER — SIMETHICONE 80 MG PO CHEW: 80.0000 mg | CHEWABLE_TABLET | ORAL | Status: DC | PRN

## 2021-08-31 MED ORDER — ONDANSETRON HCL 4 MG/2ML IJ SOLN: 4.0000 mg | Freq: Three times a day (TID) | INTRAMUSCULAR | Status: DC | PRN

## 2021-08-31 MED ORDER — MORPHINE SULFATE (PF) 0.5 MG/ML IJ SOLN
INTRAMUSCULAR | Status: DC | PRN
  Administered 2021-08-31: 150 ug via INTRATHECAL

## 2021-08-31 MED ORDER — PHENYLEPHRINE 40 MCG/ML (10ML) SYRINGE FOR IV PUSH (FOR BLOOD PRESSURE SUPPORT)
PREFILLED_SYRINGE | INTRAVENOUS | Status: AC
  Filled 2021-08-31: qty 10

## 2021-08-31 MED ORDER — DIBUCAINE (PERIANAL) 1 % EX OINT: 1.0000 "application " | TOPICAL_OINTMENT | CUTANEOUS | Status: DC | PRN

## 2021-08-31 MED ORDER — WITCH HAZEL-GLYCERIN EX PADS: 1.0000 "application " | MEDICATED_PAD | CUTANEOUS | Status: DC | PRN

## 2021-08-31 MED ORDER — NALOXONE HCL 4 MG/10ML IJ SOLN
1.0000 ug/kg/h | INTRAVENOUS | Status: DC | PRN
  Filled 2021-08-31: qty 5

## 2021-08-31 MED ORDER — MORPHINE SULFATE (PF) 0.5 MG/ML IJ SOLN
INTRAMUSCULAR | Status: AC
  Filled 2021-08-31: qty 10

## 2021-08-31 MED ORDER — CEFAZOLIN SODIUM-DEXTROSE 2-4 GM/100ML-% IV SOLN
INTRAVENOUS | Status: AC
  Filled 2021-08-31: qty 100

## 2021-08-31 MED ORDER — ONDANSETRON HCL 4 MG/2ML IJ SOLN: 4.0000 mg | Freq: Once | INTRAMUSCULAR | Status: DC | PRN

## 2021-08-31 MED ORDER — OXYCODONE HCL 5 MG PO TABS
5.0000 mg | ORAL_TABLET | ORAL | Status: DC | PRN
  Administered 2021-09-01 – 2021-09-02 (×3): 5 mg via ORAL
  Filled 2021-08-31 (×3): qty 1

## 2021-08-31 MED ORDER — DIPHENHYDRAMINE HCL 50 MG/ML IJ SOLN: 12.5000 mg | INTRAMUSCULAR | Status: DC | PRN

## 2021-08-31 MED ORDER — SODIUM CHLORIDE 0.9% FLUSH: 3.0000 mL | INTRAVENOUS | Status: DC | PRN

## 2021-08-31 MED ORDER — COCONUT OIL OIL
1.0000 "application " | TOPICAL_OIL | Status: DC | PRN
  Administered 2021-09-01: 1 via TOPICAL

## 2021-08-31 MED ORDER — HYDROMORPHONE HCL 1 MG/ML IJ SOLN: 0.2000 mg | INTRAMUSCULAR | Status: DC | PRN

## 2021-08-31 MED ORDER — METOCLOPRAMIDE HCL 5 MG/ML IJ SOLN
INTRAMUSCULAR | Status: AC
  Filled 2021-08-31: qty 2

## 2021-08-31 MED ORDER — SOD CITRATE-CITRIC ACID 500-334 MG/5ML PO SOLN
ORAL | Status: AC
  Filled 2021-08-31: qty 30

## 2021-08-31 MED ORDER — DEXAMETHASONE SODIUM PHOSPHATE 4 MG/ML IJ SOLN
INTRAMUSCULAR | Status: DC | PRN
  Administered 2021-08-31: 8 mg via INTRAVENOUS

## 2021-08-31 MED ORDER — DIPHENHYDRAMINE HCL 25 MG PO CAPS: 25.0000 mg | ORAL_CAPSULE | Freq: Four times a day (QID) | ORAL | Status: DC | PRN

## 2021-08-31 MED ORDER — SODIUM CHLORIDE 0.9 % IR SOLN
Status: DC | PRN
  Administered 2021-08-31: 1

## 2021-08-31 MED ORDER — HYDROMORPHONE HCL 1 MG/ML IJ SOLN: 0.2500 mg | INTRAMUSCULAR | Status: DC | PRN

## 2021-08-31 MED ORDER — OXYTOCIN-SODIUM CHLORIDE 30-0.9 UT/500ML-% IV SOLN
INTRAVENOUS | Status: AC
  Filled 2021-08-31: qty 500

## 2021-08-31 MED ORDER — TETANUS-DIPHTH-ACELL PERTUSSIS 5-2.5-18.5 LF-MCG/0.5 IM SUSY: 0.5000 mL | PREFILLED_SYRINGE | Freq: Once | INTRAMUSCULAR | Status: DC

## 2021-08-31 MED ORDER — PHENYLEPHRINE HCL-NACL 20-0.9 MG/250ML-% IV SOLN
INTRAVENOUS | Status: DC | PRN
  Administered 2021-08-31: 60 ug/min via INTRAVENOUS

## 2021-08-31 MED ORDER — ONDANSETRON HCL 4 MG/2ML IJ SOLN
INTRAMUSCULAR | Status: DC | PRN
Start: 2021-08-31 — End: 2021-08-31
  Administered 2021-08-31: 4 mg via INTRAVENOUS

## 2021-08-31 MED ORDER — PRENATAL MULTIVITAMIN CH
1.0000 | ORAL_TABLET | Freq: Every day | ORAL | Status: DC
  Administered 2021-09-01: 1 via ORAL
  Filled 2021-08-31: qty 1

## 2021-08-31 MED ORDER — KETOROLAC TROMETHAMINE 30 MG/ML IJ SOLN
30.0000 mg | Freq: Four times a day (QID) | INTRAMUSCULAR | Status: AC
  Administered 2021-08-31 – 2021-09-01 (×3): 30 mg via INTRAVENOUS
  Filled 2021-08-31 (×3): qty 1

## 2021-08-31 MED ORDER — OXYTOCIN-SODIUM CHLORIDE 30-0.9 UT/500ML-% IV SOLN: 2.5000 [IU]/h | INTRAVENOUS | Status: AC

## 2021-08-31 MED ORDER — KETOROLAC TROMETHAMINE 30 MG/ML IJ SOLN
30.0000 mg | Freq: Once | INTRAMUSCULAR | Status: AC | PRN
  Administered 2021-08-31: 30 mg via INTRAVENOUS

## 2021-08-31 MED ORDER — NALOXONE HCL 0.4 MG/ML IJ SOLN: 0.4000 mg | INTRAMUSCULAR | Status: DC | PRN

## 2021-08-31 MED ORDER — OXYTOCIN-SODIUM CHLORIDE 30-0.9 UT/500ML-% IV SOLN
INTRAVENOUS | Status: DC | PRN
  Administered 2021-08-31: 300 mL via INTRAVENOUS

## 2021-08-31 MED ORDER — KETOROLAC TROMETHAMINE 30 MG/ML IJ SOLN
INTRAMUSCULAR | Status: AC
  Filled 2021-08-31: qty 1

## 2021-08-31 MED ORDER — STERILE WATER FOR IRRIGATION IR SOLN
Status: DC | PRN
  Administered 2021-08-31: 1000 mL

## 2021-08-31 MED ORDER — IBUPROFEN 600 MG PO TABS
600.0000 mg | ORAL_TABLET | Freq: Four times a day (QID) | ORAL | Status: DC
  Administered 2021-09-01 – 2021-09-02 (×5): 600 mg via ORAL
  Filled 2021-08-31 (×5): qty 1

## 2021-08-31 MED ORDER — PHENYLEPHRINE HCL (PRESSORS) 10 MG/ML IV SOLN
INTRAVENOUS | Status: DC | PRN
Start: 2021-08-31 — End: 2021-08-31
  Administered 2021-08-31 (×3): 80 ug via INTRAVENOUS

## 2021-08-31 MED ORDER — FENTANYL CITRATE (PF) 100 MCG/2ML IJ SOLN
INTRAMUSCULAR | Status: DC | PRN
  Administered 2021-08-31: 15 ug via INTRATHECAL

## 2021-08-31 MED ORDER — BUPIVACAINE IN DEXTROSE 0.75-8.25 % IT SOLN
INTRATHECAL | Status: DC | PRN
  Administered 2021-08-31: 12.75 mg via INTRATHECAL

## 2021-08-31 MED ORDER — SOD CITRATE-CITRIC ACID 500-334 MG/5ML PO SOLN
30.0000 mL | ORAL | Status: AC
  Administered 2021-08-31: 30 mL via ORAL

## 2021-08-31 MED ORDER — METOCLOPRAMIDE HCL 5 MG/ML IJ SOLN
INTRAMUSCULAR | Status: DC | PRN
  Administered 2021-08-31: 10 mg via INTRAVENOUS

## 2021-08-31 MED ORDER — CEFAZOLIN SODIUM-DEXTROSE 2-3 GM-%(50ML) IV SOLR
INTRAVENOUS | Status: DC | PRN
  Administered 2021-08-31: 2 g via INTRAVENOUS

## 2021-08-31 MED ORDER — OXYCODONE HCL 5 MG/5ML PO SOLN: 5.0000 mg | Freq: Once | ORAL | Status: DC | PRN

## 2021-08-31 MED ORDER — ACETAMINOPHEN 500 MG PO TABS
1000.0000 mg | ORAL_TABLET | Freq: Four times a day (QID) | ORAL | Status: DC
  Administered 2021-08-31 – 2021-09-02 (×9): 1000 mg via ORAL
  Filled 2021-08-31 (×9): qty 2

## 2021-08-31 MED ORDER — ZOLPIDEM TARTRATE 5 MG PO TABS: 5.0000 mg | ORAL_TABLET | Freq: Every evening | ORAL | Status: DC | PRN

## 2021-08-31 MED ORDER — DIPHENHYDRAMINE HCL 25 MG PO CAPS: 25.0000 mg | ORAL_CAPSULE | ORAL | Status: DC | PRN

## 2021-08-31 SURGICAL SUPPLY — 40 items
ADH SKN CLS APL DERMABOND .7 (GAUZE/BANDAGES/DRESSINGS) ×1
ADH SKN CLS LQ APL DERMABOND (GAUZE/BANDAGES/DRESSINGS) ×1
APL SKNCLS STERI-STRIP NONHPOA (GAUZE/BANDAGES/DRESSINGS) ×1
BENZOIN TINCTURE PRP APPL 2/3 (GAUZE/BANDAGES/DRESSINGS) ×1 IMPLANT
CHLORAPREP W/TINT 26ML (MISCELLANEOUS) ×3 IMPLANT
CLAMP CORD UMBIL (MISCELLANEOUS) IMPLANT
CLOSURE STERI STRIP 1/2 X4 (GAUZE/BANDAGES/DRESSINGS) ×1 IMPLANT
CLOTH BEACON ORANGE TIMEOUT ST (SAFETY) ×3 IMPLANT
DERMABOND ADHESIVE PROPEN (GAUZE/BANDAGES/DRESSINGS) ×1
DERMABOND ADVANCED (GAUZE/BANDAGES/DRESSINGS) ×1
DERMABOND ADVANCED .7 DNX12 (GAUZE/BANDAGES/DRESSINGS) ×2 IMPLANT
DERMABOND ADVANCED .7 DNX6 (GAUZE/BANDAGES/DRESSINGS) IMPLANT
DRESSING OPSITE X SMALL 2X3 (GAUZE/BANDAGES/DRESSINGS) ×1 IMPLANT
DRSG OPSITE POSTOP 4X10 (GAUZE/BANDAGES/DRESSINGS) ×3 IMPLANT
ELECT REM PT RETURN 9FT ADLT (ELECTROSURGICAL) ×2
ELECTRODE REM PT RTRN 9FT ADLT (ELECTROSURGICAL) ×2 IMPLANT
EXTRACTOR VACUUM KIWI (MISCELLANEOUS) IMPLANT
GLOVE BIO SURGEON STRL SZ 6.5 (GLOVE) ×3 IMPLANT
GLOVE BIOGEL PI IND STRL 6.5 (GLOVE) ×2 IMPLANT
GLOVE BIOGEL PI IND STRL 7.0 (GLOVE) ×4 IMPLANT
GLOVE BIOGEL PI INDICATOR 6.5 (GLOVE) ×1
GLOVE BIOGEL PI INDICATOR 7.0 (GLOVE) ×2
GOWN STRL REUS W/TWL LRG LVL3 (GOWN DISPOSABLE) ×6 IMPLANT
KIT ABG SYR 3ML LUER SLIP (SYRINGE) ×3 IMPLANT
NDL HYPO 25X5/8 SAFETYGLIDE (NEEDLE) ×2 IMPLANT
NEEDLE HYPO 25X5/8 SAFETYGLIDE (NEEDLE) ×2 IMPLANT
NS IRRIG 1000ML POUR BTL (IV SOLUTION) ×3 IMPLANT
PACK C SECTION WH (CUSTOM PROCEDURE TRAY) ×3 IMPLANT
PAD OB MATERNITY 4.3X12.25 (PERSONAL CARE ITEMS) ×3 IMPLANT
PENCIL SMOKE EVAC W/HOLSTER (ELECTROSURGICAL) ×3 IMPLANT
SUT PLAIN 0 NONE (SUTURE) IMPLANT
SUT PLAIN 2 0 (SUTURE) ×2
SUT PLAIN ABS 2-0 CT1 27XMFL (SUTURE) ×2 IMPLANT
SUT VIC AB 0 CT1 36 (SUTURE) ×3 IMPLANT
SUT VIC AB 0 CTX 36 (SUTURE) ×4
SUT VIC AB 0 CTX36XBRD ANBCTRL (SUTURE) ×4 IMPLANT
SUT VIC AB 4-0 PS2 27 (SUTURE) ×3 IMPLANT
TOWEL OR 17X24 6PK STRL BLUE (TOWEL DISPOSABLE) ×3 IMPLANT
TRAY FOLEY W/BAG SLVR 14FR LF (SET/KITS/TRAYS/PACK) IMPLANT
WATER STERILE IRR 1000ML POUR (IV SOLUTION) ×3 IMPLANT

## 2021-08-31 NOTE — Op Note (Signed)
PROCEDURE DATE: 08/31/21   PREOPERATIVE DIAGNOSIS: Intrauterine pregnancy at  74 wga, Indication: repeat x2  and desires permanent sterility.    POSTOPERATIVE DIAGNOSIS: The same   PROCEDURE:    repeat Low Transverse Cesarean Section with BTL   SURGEON:  Dr. Belva Agee   INDICATIONS: This is a 31 yo G3P2002 at 39.0 wga requiring cesarean section secondary to desires repeat and desires permanent sterility. Decision made to proceed with LTCS. The risks of cesarean section discussed with the patient included but were not limited to: bleeding which may require transfusion or reoperation; infection which may require antibiotics; injury to bowel, bladder, ureters or other surrounding organs; injury to the fetus; need for additional procedures including hysterectomy in the event of a life-threatening hemorrhage; placental abnormalities wth subsequent pregnancies, incisional problems, thromboembolic phenomenon and other postoperative/anesthesia complications. We also discussed bilateral tubal ligation in detail. The alternatives to permanent sterilization were reviewed and it was discussed that this is considered permanent. We reviewed the risks in detail including regret, failure and ectopic pregnancy.   The patient agreed with the proposed plan, giving informed consent for the procedure.     FINDINGS:  Viable female infant in vertex presentation, APGARspending,  Weight pending, Amniotic fluid clear,  Intact placenta, three vessel cord.  Grossly normal uterus with uterine window, ovaries and fallopian tubes. .   ANESTHESIA:    Epidural ESTIMATED BLOOD LOSS: 319cc SPECIMENS: Placenta for routine COMPLICATIONS: None immediate    PROCEDURE IN DETAIL:     The patient received intravenous antibiotics (2g Ancef) and had sequential compression devices applied to her lower extremities while in the preoperative area.  She was then taken to the operating room where epidural anesthesia was dosed up to surgical  level and was found to be adequate. She was then placed in a dorsal supine position with a leftward tilt, and prepped and draped in a sterile manner.  A foley catheter was placed into her bladder and attached to constant gravity.  After an adequate timeout was performed, a Pfannenstiel skin incision was made with scalpel and carried through to the underlying layer of fascia. The fascia was incised in the midline and this incision was extended bilaterally using the Mayo scissors. Kocher clamps were applied to the superior aspect of the fascial incision and the underlying rectus muscles were dissected off bluntly. A similar process was carried out on the inferior aspect of the facial incision. The rectus muscles were separated in the midline bluntly and the peritoneum was entered bluntly.  A bladder flap was created sharply and developed bluntly. A transverse hysterotomy was made with a scalpel and extended bilaterally bluntly. The bladder blade was then removed. The infant was successfully delivered, and cord was clamped and cut and infant was handed over to awaiting neonatology team. Uterine massage was then administered and the placenta delivered intact with three-vessel cord. Cord gases were taken. The uterus was cleared of clot and debris.  The hysterotomy was closed with 0 vicryl.  A second imbricating suture of 0-vicryl was used to reinforce the incision and aid in hemostasis. A bilateral tubal ligation was performed via modified pomeroy in the usual fashion.  Good hemostasis was noted before and after uterus and fallopian tubes placed back into abdomen. The fascia was closed with 0-Vicryl in a running fashion with good restoration of anatomy.  The subcutaneus tissue was irrigated and was reapproximated using three interrupted plain gut stitches.  The skin was closed with 4-0 Vicryl in a subcuticular  fashion.   Final EBL was 3119ccc (all surgical site and was hemostatic at end of procedure) without any  further bleeding on exam.   It's a bot - "Elijah"!!    Pt tolerated the procedure well. All sponge/lap/needle counts were correct  X 2. Pt taken to recovery room in stable condition.     Belva Agee MD

## 2021-08-31 NOTE — Transfer of Care (Signed)
Immediate Anesthesia Transfer of Care Note  Patient: Bethany Perkins  Procedure(s) Performed: REPEAT CESAREAN SECTION WITH BILATERAL TUBAL LIGATION EDC: 09-07-21 ALLEG: NKDA  PREVIOUS X 2  Patient Location: PACU  Anesthesia Type:Spinal  Level of Consciousness: awake, alert  and oriented  Airway & Oxygen Therapy: Patient Spontanous Breathing  Post-op Assessment: Report given to RN and Post -op Vital signs reviewed and stable  Post vital signs: Reviewed and stable  Last Vitals:  Vitals Value Taken Time  BP 101/59 08/31/21 0827  Temp    Pulse 101 08/31/21 0829  Resp 21 08/31/21 0829  SpO2 98 % 08/31/21 0829  Vitals shown include unvalidated device data.  Last Pain:  Vitals:   08/31/21 0550  TempSrc: Oral  PainSc: 0-No pain         Complications: No notable events documented.

## 2021-08-31 NOTE — Progress Notes (Signed)
No updates to above H&P. Patient arrived NPO and was consented in PACU. Risks again discussed, all questions answered, and consent signed. Proceed with above surgery.    Gauri Galvao MD  

## 2021-08-31 NOTE — Lactation Note (Signed)
This note was copied from a baby's chart. Lactation Consultation Note  Patient Name: Bethany Perkins [id number] Date: 08/31/2021 Reason for consult: Initial assessment;Term;1st time breastfeeding;Other (Comment) (LGA) Age:30 hours   P3 mother whose infant is now 57 hours old.  This is a term baby at 4+38 weeks old.  Mother did not breast feed her first two children.  Mother's current feeding preference is breast.  Baby "[name]" is LGA.  Visited with family at 2 hours after birth.  Reviewed breast feeding basics.  Taught hand expression and mother was able to easily express colostrum drops which I finger fed to "[name]."  First attempt to latch was unsuccessful prior to giving colostrum drops.  However, after colostrum drops, "[name]" began to awaken more and actively suck.  Demonstrated breast compressions and gentle stimulation to keep him actively engaged.  Father at bedside and very involved and supportive with breast feeding.  Mother determined to make this breast feeding experience a success since she was unable to breast feed her first two children.  Provided a manual pump and breast shells with instructions for use.  #24 flange size is appropriate at this time.  Encouraged mother to call her RN/LC for latch assistance as needed.  Due to LGA I suggested feeding at least every three hours or sooner if "[name]" shows cues.  Parents appreciative of visit.  Grandmother present and supportive.  RN updated.   Maternal Data Has patient been taught Hand Expression?: Yes Does the patient have breastfeeding experience prior to this delivery?: No  Feeding Mother's Current Feeding Choice: Breast Milk  LATCH Score Latch: Grasps breast easily, tongue down, lips flanged, rhythmical sucking.  Audible Swallowing: A few with stimulation  Type of Nipple: Flat  Comfort (Breast/Nipple): Soft / non-tender  Hold (Positioning): Assistance needed to correctly position infant at breast and maintain latch.  LATCH  Score: 7   Lactation Tools Discussed/Used Tools: Pump;Shells Breast pump type: Manual Pump Education: Setup, frequency, and cleaning;Milk Storage Reason for Pumping: Breast stimulation for supplementation and nipple eversion Pumping frequency: Before feedings and prn  Interventions Interventions: Assisted with latch;Skin to skin;Breast feeding basics reviewed;Breast massage;Hand express;Pre-pump if needed;Breast compression;Hand pump;Shells;Position options;Support pillows;Adjust position;Education  Discharge Pump: Personal WIC Program: No  Consult Status Consult Status: Follow-up Date: 09/01/21 Follow-up type: In-patient    Andraya Frigon R Lavoris Sparling 08/31/2021, 11:05 AM

## 2021-08-31 NOTE — Anesthesia Procedure Notes (Signed)
Spinal  Patient location during procedure: OB Start time: 08/31/2021 7:20 AM End time: 08/31/2021 7:24 AM Reason for block: surgical anesthesia Staffing Performed: anesthesiologist  Anesthesiologist: Trevor Iha, MD Preanesthetic Checklist Completed: patient identified, IV checked, risks and benefits discussed, surgical consent, monitors and equipment checked, pre-op evaluation and timeout performed Spinal Block Patient position: sitting Prep: DuraPrep and site prepped and draped Patient monitoring: heart rate, cardiac monitor, continuous pulse ox and blood pressure Approach: midline Location: L3-4 Injection technique: single-shot Needle Needle type: Pencan  Needle gauge: 24 G Needle length: 10 cm Needle insertion depth: 6 cm Assessment Sensory level: T4 Events: CSF return Additional Notes  1 Attempt (s). Pt tolerated procedure well.

## 2021-08-31 NOTE — Anesthesia Postprocedure Evaluation (Signed)
Anesthesia Post Note  Patient: TASHELL ASLAM  Procedure(s) Performed: REPEAT CESAREAN SECTION WITH BILATERAL TUBAL LIGATION EDC: 09-07-21 ALLEG: NKDA  PREVIOUS X 2     Patient location during evaluation: Mother Baby Anesthesia Type: Spinal Level of consciousness: oriented and awake and alert Pain management: pain level controlled Vital Signs Assessment: post-procedure vital signs reviewed and stable Respiratory status: spontaneous breathing and respiratory function stable Cardiovascular status: blood pressure returned to baseline and stable Postop Assessment: no headache, no backache, no apparent nausea or vomiting and able to ambulate Anesthetic complications: no   No notable events documented.  Last Vitals:  Vitals:   08/31/21 0950 08/31/21 1100  BP: 104/70 116/70  Pulse: 90 74  Resp: 12 16  Temp: (!) 36.3 C   SpO2: 98% 97%    Last Pain:  Vitals:   08/31/21 1100  TempSrc:   PainSc: 0-No pain   Pain Goal:                   Barnet Glasgow

## 2021-09-01 ENCOUNTER — Encounter (HOSPITAL_COMMUNITY): Payer: Self-pay | Admitting: Obstetrics and Gynecology

## 2021-09-01 LAB — CBC
HCT: 28.9 % — ABNORMAL LOW (ref 36.0–46.0)
Hemoglobin: 9.2 g/dL — ABNORMAL LOW (ref 12.0–15.0)
MCH: 26.6 pg (ref 26.0–34.0)
MCHC: 31.8 g/dL (ref 30.0–36.0)
MCV: 83.5 fL (ref 80.0–100.0)
Platelets: 251 10*3/uL (ref 150–400)
RBC: 3.46 MIL/uL — ABNORMAL LOW (ref 3.87–5.11)
RDW: 15.1 % (ref 11.5–15.5)
WBC: 15.9 10*3/uL — ABNORMAL HIGH (ref 4.0–10.5)
nRBC: 0 % (ref 0.0–0.2)

## 2021-09-01 MED ORDER — FERROUS SULFATE 325 (65 FE) MG PO TABS
325.0000 mg | ORAL_TABLET | ORAL | Status: DC
  Administered 2021-09-01: 325 mg via ORAL
  Filled 2021-09-01: qty 1

## 2021-09-01 MED ORDER — DOCUSATE SODIUM 100 MG PO CAPS
100.0000 mg | ORAL_CAPSULE | Freq: Every day | ORAL | Status: DC
  Administered 2021-09-01 – 2021-09-02 (×2): 100 mg via ORAL
  Filled 2021-09-01 (×2): qty 1

## 2021-09-01 NOTE — Progress Notes (Signed)
Postpartum Progress Note  Postpartum Day 1 s/p repeat Cesarean section.  Subjective:  Patient reports no overnight events.  She reports well controlled pain, ambulating without difficulty, voiding spontaneously, tolerating PO.  She reports Positive flatus, Negative BM.  Vaginal bleeding is appropriate.  Objective: Blood pressure 106/62, pulse 91, temperature 98.4 F (36.9 C), temperature source Axillary, resp. rate 18, height 5\' 8"  (1.727 m), weight 97.4 kg, SpO2 98 %, unknown if currently breastfeeding.  Physical Exam:  General: alert and no distress Lochia: appropriate Uterine Fundus: firm Incision: dressing in place DVT Evaluation: No evidence of DVT seen on physical exam.  Recent Labs    08/29/21 0912 09/01/21 0449  HGB 11.1* 9.2*  HCT 34.5* 28.9*    Assessment/Plan: Postpartum Day 1, s/p C-section Postoperative acute blood loss anemia - Fe/Colace Baby boy - will provide circ when cleared Lactation following Doing well, continue routine postpartum care. Anticipate discharge home PPD 2 or 3   LOS: 1 day   10/30/21 09/01/2021, 7:33 AM

## 2021-09-01 NOTE — Progress Notes (Signed)
CSW contact Bethany Perkins via room [phone] to complete consult for mental health. CSW explained role, and reason for consult. Bethany Perkins was pleasant, and polite during engagement with CSW. Bethany Perkins reported, history of anxiety in high school years. Bethany Perkins reported, she mainly experience anxiousness. Bethany Perkins denied any history of psychotropic medication, and therapy involvement. Bethany Perkins reported, she has been able to manage symptoms without medication needed. Bethany Perkins declined mental health resources. CSW encourage Bethany Perkins to implement healthy coping skills when symptoms arises.   Bethany Perkins reported, history of postpartum depression with her previous child in 2019. CSW provided education regarding the baby blues period vs. perinatal mood disorders, discussed treatment and gave resources for mental health follow up if concerns arise. CSW recommends self- evaluation during the postpartum time period using the New Mom Checklist from Postpartum Progress and encouraged Bethany Perkins to contact a medical professional if symptoms are noted at any time.   Bethany Perkins reported, since delivery she feels, "good". Bethany Perkins reported, FOB, her parents, and family are very supportive. Bethany Perkins denied SI, HI, and DV when CSW assessed for safety.   Bethany Perkins reported, there are no transportation barriers to follow up infant's care. Bethany Perkins reported, she has all essentials needed to care for infant. Bethany Perkins reported, infant has a car seat, and crib. Bethany Perkins denied any additional barriers.     CSW provided education on sudden infant death syndrome (SIDS).  CSW identifies no further need for intervention or barriers to discharge at this time.  Darcus Austin, MSW, LCSW-A Clinical Social Worker- Weekends [phone]

## 2021-09-01 NOTE — Lactation Note (Signed)
This note was copied from a baby's chart. Lactation Consultation Note  Patient Name: Boy Sandria Mcenroe [id number] Date: 09/01/2021 Reason for consult: Follow-up assessment;Term;RN request;Breastfeeding assistance;Infant weight loss;Other (Comment) (DAT+) Age:30 hours  RN requested lactation to see Ms. Nader this morning due to concerns about frequent breast feedings and maternal request for donor breast milk (DBM). Upon entry, I noted baby was on the right breast. She switched him to the left side in football hold and hand expressed colostrum. He latched readily. I checked baby's latch and noted good flanging and rhythmic suckling sequences.  Baby is LGA and DAT+ with 6% weight loss in the first 24 hours. Ms. Mortellaro expressed concerns regarding overnight and morning cluster feeding with continued feeding cues after baby is latched.  I observed strong feeding cues at this session. Baby visibly relaxed as he was latched. We discussed donor breast milk ,and Ms. Brannick signed a consent form. Her RN brought in the breast feeding supplementation guide, and we agreed to start baby at 7-12 mls post breast feeding with DBM. DBM is not readily available at this time; the RN put in a request to the milk lab.  Ms. Olazabal has a DEBP in the room. She has pumped one time. She asked me to check her flanges, and it appeared that a size 24 would be an appropriate fit. I encouraged her to try the 27 flange if she experiences pressure or pain with pumping.  Plan: Continue to breast feed [name] on demand 8-12 times today. Switch nurse baby with feedings. Supplement 7-12 mls by cup or bottle (cup provided). Parents would like to try cup feeding today to allow baby more time to acclimate to the breast. Pump, as able or hand express. Provide EBM to baby by spoon or cup. If baby is nursing frequently, Ms. Sardo should prioritize rest.  Maternal Data Has patient been taught Hand Expression?: Yes Does  the patient have breastfeeding experience prior to this delivery?: Yes  Feeding Mother's Current Feeding Choice: Breast Milk and Donor Milk  LATCH Score Latch: Grasps breast easily, tongue down, lips flanged, rhythmical sucking.  Audible Swallowing: A few with stimulation  Type of Nipple: Everted at rest and after stimulation  Comfort (Breast/Nipple): Soft / non-tender  Hold (Positioning): No assistance needed to correctly position infant at breast.  LATCH Score: 9   Lactation Tools Discussed/Used Tools: Other (comment);Pump;Flanges (feeding cup) Flange Size: 24 (may try a 27 to see if it's more comfortable) Breast pump type: Double-Electric Breast Pump Pump Education: Setup, frequency, and cleaning Reason for Pumping: stimulation; supplementation Pumping frequency: as able today after breast feeding Pumped volume:  (droplets)  Interventions Interventions: Breast feeding basics reviewed;Skin to skin;Support pillows;Education;Hand express  Consult Status Consult Status: Follow-up Date: 09/02/21 Follow-up type: In-patient    Walker Shadow 09/01/2021, 9:25 AM

## 2021-09-02 ENCOUNTER — Encounter (HOSPITAL_COMMUNITY): Payer: Self-pay | Admitting: Obstetrics and Gynecology

## 2021-09-02 MED ORDER — ACETAMINOPHEN 325 MG PO TABS: 650.0000 mg | ORAL_TABLET | Freq: Four times a day (QID) | ORAL | 0 refills | Status: DC | PRN

## 2021-09-02 MED ORDER — OXYCODONE HCL 5 MG PO TABS
5.0000 mg | ORAL_TABLET | ORAL | 0 refills | Status: DC | PRN
Start: 2021-09-02 — End: 2022-10-01

## 2021-09-02 MED ORDER — DOCUSATE SODIUM 100 MG PO CAPS: 100.0000 mg | ORAL_CAPSULE | Freq: Every day | ORAL | 0 refills | Status: DC

## 2021-09-02 MED ORDER — IBUPROFEN 600 MG PO TABS: 600.0000 mg | ORAL_TABLET | Freq: Four times a day (QID) | ORAL | 0 refills | Status: DC

## 2021-09-02 MED ORDER — FERROUS SULFATE 325 (65 FE) MG PO TABS: 325.0000 mg | ORAL_TABLET | ORAL | 3 refills | Status: DC

## 2021-09-02 NOTE — Lactation Note (Addendum)
This note was copied from a baby's chart. Lactation Consultation Note  Patient Name: Bethany Perkins [id number] Date: 09/02/2021 Reason for consult: Follow-up assessment;Term;Nipple pain/trauma Age:30 hours  RN called for [facility] to consult with Mom.  Pediatrician just finishing up her exam and baby is to be discharged with Mom today.   Baby is at 9% weight loss and adequate output.  Baby lost 2.6% from yesterday.    Baby has been breastfeeding with cues, at least every 3 hrs and being supplemented with donor BM and EBM by finger feeding.     Mom reports her breasts are heavier this am.  Some old positional stripe bruising noted on both nipple tips.  Mom denies pain with latch after first few seconds.  LC watched as Mom positioned baby for football hold.  LC added more pillow support.  Baby cueing vigorously and latched easily and deeply to breast.  Showed FOB how to assess lower lip and offer a chin tug to flange and open his mouth wider.  Baby has a wide gape onto breast and deep jaw extensions.  Assisted Mom to use alternate breast compression to increase swallowing.  Baby vigorous and consistent and relaxed at breast.  Encouraged STS and offering the breast with cues, awakening baby at 3 hrs if asleep (due to weight loss) until baby starts regaining weight.  Mom will pump after breastfeeding and offer an EBM to baby.  Reviewed paced bottle feeding as an alternative to syringe feeding.  Mom to pump both breasts if baby is supplemented, for 15-30 mins.  Engorgement prevention and treatment reviewed.  Reviewed cleaning routine with pump parts.  Mom recommended to f/u with OP Lactation consultation.  Mom desires this and message sent to clinic.  Encouraged Mom to call prn for concerns.   LATCH Score Latch: Grasps breast easily, tongue down, lips flanged, rhythmical sucking.  Audible Swallowing: Spontaneous and intermittent  Type of Nipple: Everted at rest and after  stimulation  Comfort (Breast/Nipple): Filling, red/small blisters or bruises, mild/mod discomfort  Hold (Positioning): Assistance needed to correctly position infant at breast and maintain latch.  LATCH Score: 8   Lactation Tools Discussed/Used Tools: Pump;Flanges Breast pump type: Double-Electric Breast Pump Pump Education: Setup, frequency, and cleaning Pumping frequency: Encouraged to pump after breastfeeding Pumped volume: 5 mL  Interventions Interventions: Breast feeding basics reviewed;Assisted with latch;Skin to skin;Breast massage;Hand express;Breast compression;Adjust position;Support pillows;Position options;Expressed milk;DEBP;Pace feeding;Education  Discharge Discharge Education: Engorgement and breast care;Warning signs for feeding baby;Outpatient recommendation;Outpatient Epic message sent Pump: Personal;DEBP (Spectra 2)  Consult Status Consult Status: Complete Date: 09/02/21 Follow-up type: [place], Dreya Buhrman E 09/02/2021, 8:54 AM

## 2021-09-02 NOTE — Discharge Summary (Signed)
Obstetric Discharge Summary  Bethany Perkins is a 31 y.o. female that presented on 08/31/2021 for 08/31/2021.  She was admitted to labor and delivery for scheduled repeat C section with tubal sterilization.  She delivered a viable female infant on 08/31/21.  Her postpartum course was uncomplicated and on PPD#2, she reported well controlled pain, spontaneous voiding, ambulating without difficulty, and tolerating PO.  She was stable for discharge home on 09/02/2021 with plans for in-office follow up.  Hemoglobin  Date Value Ref Range Status  09/01/2021 9.2 (L) 12.0 - 15.0 g/dL Final   HCT  Date Value Ref Range Status  09/01/2021 28.9 (L) 36.0 - 46.0 % Final    Physical Exam:  General: alert and no distress Lochia: appropriate Uterine Fundus: firm Incision: healing well DVT Evaluation: No evidence of DVT seen on physical exam.  Discharge Diagnoses: Term Pregnancy-delivered  Discharge Information: Date: 09/02/2021 Activity: Pelvic rest, as tolerated Diet: routine Medications: Tylenol, motrin, oxycodone, iron, colace Condition: stable Instructions: Refer to practice specific booklet.  Discussed prior to discharge.  Discharge to: Home  Follow-up Information     [place] Follow up.   Why: Please follow up for 6 week postpartum visit. Contact information: [address] [place] [address] [phone]                 Newborn Data: Live born female  Birth Weight: 9 lb 15.1 oz (4510 g) APGAR: 8, 9  Newborn Delivery   Birth date/time: 08/31/2021 07:46:00 Delivery type: C-Section, Low Transverse Trial of labor: No C-section categorization: Repeat      Home with mother.  Lyn Henri 09/02/2021, 3:41 PM

## 2021-09-02 NOTE — Progress Notes (Signed)
Postpartum Progress Note  Postpartum Day 2 s/p repeat Cesarean section.  Subjective:  Patient reports no overnight events.  She reports well controlled pain, ambulating without difficulty, voiding spontaneously, tolerating PO.  She reports Positive flatus, Negative BM.  Vaginal bleeding is appropriate.  Objective: Blood pressure 113/71, pulse 83, temperature 98 F (36.7 C), temperature source Oral, resp. rate 17, height 5\' 8"  (1.727 m), weight 97.4 kg, SpO2 98 %, unknown if currently breastfeeding.  Physical Exam:  General: alert and no distress Lochia: appropriate Uterine Fundus: firm Incision: dressing in place DVT Evaluation: No evidence of DVT seen on physical exam.  Recent Labs    09/01/21 0449  HGB 9.2*  HCT 28.9*     Assessment/Plan: Postpartum Day 2, s/p C-section Postoperative acute blood loss anemia - Fe/Colace Baby boy - s/p circ Lactation following Doing well, continue routine postpartum care. Anticipate discharge home today.    LOS: 2 days   10/30/21 09/02/2021, 5:38 AM

## 2021-09-03 LAB — SURGICAL PATHOLOGY

## 2021-09-05 ENCOUNTER — Other Ambulatory Visit: Payer: Self-pay | Admitting: Family Medicine

## 2021-09-05 DIAGNOSIS — B001 Herpesviral vesicular dermatitis: Secondary | ICD-10-CM

## 2021-09-05 NOTE — Telephone Encounter (Signed)
Refill request Valtrex Last office visit 05/10/20 No longer on medication list, was stopped

## 2021-09-05 NOTE — Telephone Encounter (Signed)
Cindy in the front office stated that patient called back and told her to let us know that her GYN said that it is okay for her to take the medication.

## 2021-09-05 NOTE — Telephone Encounter (Signed)
Spoke to patient by telephone and was advised that she did just have a baby. Patient stated that she is breastfeeding. Patient stated that she will reach out to her GYN and will call back tomorrow and let Dr. Glori Bickers know if it is okay for her take this medication.

## 2021-09-07 ENCOUNTER — Inpatient Hospital Stay (HOSPITAL_COMMUNITY): Admit: 2021-09-07 | Payer: Self-pay

## 2021-09-12 NOTE — Progress Notes (Signed)
Coding inqery: pregnancy is diagnosis

## 2021-09-13 ENCOUNTER — Telehealth (HOSPITAL_COMMUNITY): Payer: Self-pay | Admitting: *Deleted

## 2021-09-13 NOTE — Telephone Encounter (Signed)
Attempted hospital discharge follow-up call. Left message for patient to return RN call. Deforest Hoyles, RN, 09/13/21, [phone]

## 2022-07-26 ENCOUNTER — Ambulatory Visit: Payer: Commercial Managed Care - PPO | Admitting: Family Medicine

## 2022-10-01 ENCOUNTER — Encounter: Payer: Self-pay | Admitting: Primary Care

## 2022-10-01 ENCOUNTER — Ambulatory Visit: Payer: Commercial Managed Care - PPO | Admitting: Primary Care

## 2022-10-01 VITALS — BP 122/66 | HR 111 | Temp 98.0°F | Ht 68.0 in | Wt 195.0 lb

## 2022-10-01 DIAGNOSIS — R300 Dysuria: Secondary | ICD-10-CM | POA: Diagnosis not present

## 2022-10-01 LAB — POC URINALSYSI DIPSTICK (AUTOMATED)
Bilirubin, UA: POSITIVE
Blood, UA: POSITIVE
Glucose, UA: POSITIVE — AB
Ketones, UA: POSITIVE
Nitrite, UA: POSITIVE
Protein, UA: POSITIVE — AB
Spec Grav, UA: 1.025 (ref 1.010–1.025)
Urobilinogen, UA: 2 E.U./dL — AB
pH, UA: 6 (ref 5.0–8.0)

## 2022-10-01 MED ORDER — NITROFURANTOIN MONOHYD MACRO 100 MG PO CAPS: 100.0000 mg | ORAL_CAPSULE | Freq: Two times a day (BID) | ORAL | 0 refills | Status: AC

## 2022-10-01 NOTE — Patient Instructions (Addendum)
Take nitrofurantoin 100 mg two times a day for five days.   Please follow up with PCP if your symptoms do not improve.   We have sent the urine culture and will notify you of the results  It was a pleasure to see you today!

## 2022-10-01 NOTE — Assessment & Plan Note (Addendum)
POC UA positive for 3+ leukocytes, nitrites, 3+ blood.  Not on menstrual cycle. Urine culture pending.   Symptoms suggestive of acute cystitis.   Start Nitrofurantoin 100 mg by mouth twice daily for five days.   Patient educated about importance of finishing the antibiotic course, increasing water intake and voiding post intercourse.   She will follow up with PCP if symptoms do not improve or worsen.   I evaluated patient, was consulted regarding treatment, and agree with assessment and plan per Tinnie Gens, RN, DNP student.   Allie Bossier, NP-C

## 2022-10-01 NOTE — Progress Notes (Signed)
Subjective:    Patient ID: Bethany Perkins, female    DOB: 09-24-1990, 32 y.o.   MRN: [id number]  Dysuria  Associated symptoms include hematuria.    Bethany Perkins is a very pleasant 32 y.o. female patient of Dr. Glori Bickers with a history of herpes labialis who presents today to discuss dysuria.   Symptom onset 3 days ago with dysuria. She's also noticed increased urinary frequency and urgency. Yesterday she noticed some blood in the urine.   She denies pelvic pain, vaginal discharge, vaginal itching, abdominal pain.   She's been taking Urostat BID x 3 days, last dose was last night. LMP was 2 weeks ago.   BP Readings from Last 3 Encounters:  10/01/22 122/66  09/02/21 110/73  05/10/20 128/64      Review of Systems  Gastrointestinal:  Negative for abdominal pain.  Genitourinary:  Positive for dysuria and hematuria. Negative for pelvic pain, vaginal bleeding and vaginal discharge.         Past Medical History:  Diagnosis Date   Allergy    allergic rhinitis seasonal   Anxiety    in high school   Asthma    mild   Dysmenorrhea    Fx wrist 06/2003    Social History   Socioeconomic History   Marital status: Married    Spouse name: Not on file   Number of children: Not on file   Years of education: Not on file   Highest education level: Not on file  Occupational History   Not on file  Tobacco Use   Smoking status: Never   Smokeless tobacco: Never  Vaping Use   Vaping Use: Never used  Substance and Sexual Activity   Alcohol use: No   Drug use: No   Sexual activity: Yes    Birth control/protection: Condom  Other Topics Concern   Not on file  Social History Narrative   Finished school for Psychologist, sport and exercise.   Social Determinants of Health   Financial Resource Strain: Not on file  Food Insecurity: Not on file  Transportation Needs: Not on file  Physical Activity: Not on file  Stress: Not on file  Social Connections: Not on file  Intimate  Partner Violence: Not on file    Past Surgical History:  Procedure Laterality Date   CESAREAN SECTION N/A 04/07/2015   Procedure: CESAREAN SECTION;  Surgeon: Marylynn Pearson, MD;  Location: [facility];  Service: Obstetrics;  Laterality: N/A;   CESAREAN SECTION N/A 11/25/2017   Procedure: CESAREAN SECTION;  Surgeon: Marylynn Pearson, MD;  Location: [facility];  Service: Obstetrics;  Laterality: N/A;  Repeat edc 11/27/17 nkda need RNFA - HK to [place] WITH BILATERAL TUBAL LIGATION N/A 08/31/2021   Procedure: REPEAT CESAREAN SECTION WITH BILATERAL TUBAL LIGATION EDC: 09-07-21 ALLEG: NKDA  PREVIOUS X 2;  Surgeon: Tyson Dense, MD;  Location: [facility];  Service: Obstetrics;  Laterality: N/A;   TONSILLECTOMY     WISDOM TOOTH EXTRACTION      Family History  Problem Relation Age of Onset   Cancer Maternal Grandfather        unknown tumor   Heart disease Maternal Grandfather    Hypertension Maternal Grandfather    Diabetes Maternal Grandfather    Cancer Paternal Grandmother        breast   Hypertension Paternal Grandmother    Dementia Paternal Grandmother    Diabetes Paternal Grandmother     No Known Allergies  Current Outpatient  Medications on File Prior to Visit  Medication Sig Dispense Refill   valACYclovir (VALTREX) 1000 MG tablet TAKE 2 TABLETS (2,000 MG TOTAL) BY MOUTH 2 (TWO) TIMES DAILY. 4 tablet 3   No current facility-administered medications on file prior to visit.    BP 122/66   Pulse (!) 111   Temp 98 F (36.7 C) (Temporal)   Ht 5\' 8"  (1.727 m)   Wt 195 lb (88.5 kg)   LMP 09/15/2022 (Exact Date)   SpO2 99%   Breastfeeding No   BMI 29.65 kg/m  Objective:   Physical Exam Constitutional:      General: She is not in acute distress.    Appearance: She is not ill-appearing.  Cardiovascular:     Rate and Rhythm: Regular rhythm. Tachycardia present.  Pulmonary:     Effort: Pulmonary effort is normal.     Breath sounds: Normal breath  sounds.  Musculoskeletal:     Cervical back: Neck supple.  Skin:    General: Skin is warm and dry.           Assessment & Plan:  Dysuria Assessment & Plan: POC UA positive for 3+ leukocytes, nitrites, 3+ blood.  Not on menstrual cycle. Urine culture pending.   Symptoms suggestive of acute cystitis.   Start Nitrofurantoin 100 mg by mouth twice daily for five days.   Patient educated about importance of finishing the antibiotic course, increasing water intake and voiding post intercourse.   She will follow up with PCP if symptoms do not improve or worsen.   I evaluated patient, was consulted regarding treatment, and agree with assessment and plan per Tinnie Gens, RN, DNP student.   Allie Bossier, NP-C   Orders: -     POCT Urinalysis Dipstick (Automated) -     Urine Culture -     Nitrofurantoin Monohyd Macro; Take 1 capsule (100 mg total) by mouth 2 (two) times daily for 5 days.  Dispense: 10 capsule; Refill: 0        Bethany Koch, NP

## 2022-10-01 NOTE — Progress Notes (Signed)
Established Patient Office Visit  Subjective   Patient ID: Bethany Perkins, female    DOB: 04-27-1991  Age: 32 y.o. MRN: [id number]  Chief Complaint  Patient presents with   Dysuria    X4 days Burning with urination, frequency, urgency      Dysuria  Associated symptoms include frequency and urgency. Pertinent negatives include no chills or flank pain.    Bethany Perkins is a 32 year old female, patient of Dr. Loura Pardon, with past medical history of anxiety, hypertriglyceridemia, herpes labialis, dysmenorrhea, who presents today for an acute visit.   Her symptoms started Saturday with burning with urination, urine frequency and urgency. She states that she has not had a urinary tract infection since high school. She says that she might have seen a little bit of blood in her urine yesterday, however she said that it was nothing that concerned her. She denies any fever chills lower abdominal pain or flank pain. She denies any nausea or vomiting. She has tried Sales executive over the counter and it did provide some relief. She has been taking two in the morning and two in the evening. Her last dose was last night. Her last menstrual cycle was during the third week of January and lasted for 4-5 days.  Patient Active Problem List   Diagnosis Date Noted   Dysuria 10/01/2022   Pregnancy 08/31/2021   Hypertriglyceridemia 05/10/2020   Routine general medical examination at a health care facility 05/02/2020   Herpes labialis 01/21/2018   S/P cesarean section 04/07/2015   PROM (premature rupture of membranes) 04/06/2015   Primigravida 08/05/2014   Encounter for routine gynecological examination 03/23/2013   ANXIETY DISORDER 04/24/2010   DYSMENORRHEA 08/11/2007   Asthma 03/18/2007      Review of Systems  Constitutional:  Negative for chills and fever.  Respiratory:  Negative for shortness of breath.   Cardiovascular:  Negative for chest pain.  Gastrointestinal:  Negative for  constipation and diarrhea.  Genitourinary:  Positive for dysuria, frequency and urgency. Negative for flank pain.  Musculoskeletal:  Negative for back pain.  Neurological:  Negative for dizziness and headaches.      Objective:     BP 122/66   Pulse (!) 111   Temp 98 F (36.7 C) (Temporal)   Ht 5\' 8"  (1.727 m)   Wt 195 lb (88.5 kg)   LMP 09/15/2022 (Exact Date)   SpO2 99%   Breastfeeding No   BMI 29.65 kg/m  BP Readings from Last 3 Encounters:  10/01/22 122/66  09/02/21 110/73  05/10/20 128/64   Wt Readings from Last 3 Encounters:  10/01/22 195 lb (88.5 kg)  08/31/21 214 lb 12.8 oz (97.4 kg)  08/22/21 212 lb (96.2 kg)      Physical Exam Vitals and nursing note reviewed.  Constitutional:      Appearance: Normal appearance.  Cardiovascular:     Rate and Rhythm: Regular rhythm. Tachycardia present.     Pulses: Normal pulses.     Heart sounds: Normal heart sounds.  Pulmonary:     Effort: Pulmonary effort is normal.     Breath sounds: Normal breath sounds.  Abdominal:     General: Bowel sounds are normal.     Tenderness: There is no right CVA tenderness or left CVA tenderness.     Comments: Pelvic discomfort  Neurological:     Mental Status: She is alert and oriented to person, place, and time.      Results for orders placed or  performed in visit on 10/01/22  POCT Urinalysis Dipstick (Automated)  Result Value Ref Range   Color, UA orange    Clarity, UA clear    Glucose, UA Positive (A) Negative   Bilirubin, UA pos    Ketones, UA pos    Spec Grav, UA 1.025 1.010 - 1.025   Blood, UA pos    pH, UA 6.0 5.0 - 8.0   Protein, UA Positive (A) Negative   Urobilinogen, UA 2.0 (A) 0.2 or 1.0 E.U./dL   Nitrite, UA pos    Leukocytes, UA Large (3+) (A) Negative       The ASCVD Risk score (Arnett DK, et al., 2019) failed to calculate for the following reasons:   The 2019 ASCVD risk score is only valid for ages 76 to 52    Assessment & Plan:   Problem List  Items Addressed This Visit       Other   Dysuria - Primary    POC UA positive for leukocytes, nitrites, protein and glucose.  Urine culture pending.   Symptoms suggestive of acute cystitis.   Start Nitrofurantoin 100 mg by mouth twice daily for five days.   Patient educated about importance of finishing the antibiotic course, increasing water intake and voiding post intercourse.   She will follow up with PCP if symptoms do not improve or worsen.       Relevant Medications   nitrofurantoin, macrocrystal-monohydrate, (MACROBID) 100 MG capsule   Other Relevant Orders   POCT Urinalysis Dipstick (Automated) (Completed)   Urine Culture    No follow-ups on file.    Tinnie Gens, BSN-RN, DNP STUDENT

## 2022-10-03 LAB — URINE CULTURE
MICRO NUMBER:: 14525850
SPECIMEN QUALITY:: ADEQUATE

## 2022-12-27 LAB — HM PAP SMEAR

## 2023-11-04 ENCOUNTER — Ambulatory Visit: Payer: Self-pay | Admitting: Family Medicine

## 2023-11-04 NOTE — Telephone Encounter (Signed)
 Copied from CRM [id number]. Topic: Clinical - Red Word Triage >> Nov 04, 2023  8:26 AM Theodis Sato wrote: Red Word that prompted transfer to Nurse Triage: Swollen, red, possible infected finger with pus coming out.  Patient states there was no direct injury but does cut her son and husbands hair and states it may be a piece of hair causing this. *Symptoms began on Friday*   Chief Complaint: Finger Swelling  Symptoms: Erythema, Drainage, Pain, Warmth  Frequency: Since Friday  Pertinent Negatives: Patient denies fever, numbness, tingling  Disposition: [] ED /[] Urgent Care (no appt availability in office) / [x] Appointment(In office/virtual)/ []  [facility]/ [] Home Care/ [] Refused Recommended Disposition /[] [facility]/ []  Follow-up with PCP  Additional Notes: KP is being triaged for acute pain, swelling, redness, and drainage in her right middle finger. The patient noticed there was a hair in the fingernail cuticle area after cutting her son and husband's hair. The patient reports having pulled the hair out but since then it has developed redness, tenderness, swelling, and drainage. The patient reports using OTC antibiotic ointment. In office appointment made for tomorrow morning.   Reason for Disposition  Looks like a boil, infected sore, deep ulcer or other infected rash (spreading redness, pus)  Answer Assessment - Initial Assessment Questions 1. ONSET: "When did the swelling start?" (e.g., minutes, hours, days)     Since Friday  2. LOCATION: "What part of the hand is swollen?"  "Are both hands swollen or just one hand?"     Right Middle Finger  3. SEVERITY: "How bad is the swelling?" (e.g., localized; mild, moderate, severe)   - BALL OR LUMP: small ball or lump   - LOCALIZED: puffy or swollen area or patch of skin   - JOINT SWELLING: swelling of a joint   - MILD: puffiness or mild swelling of fingers or hand   - MODERATE: fingers and hand are swollen   - SEVERE:  swelling of entire hand and up into forearm     Localized into the finger  4. REDNESS: "Does the swelling look red or infected?"     Redness  5. PAIN: "Is the swelling painful to touch?" If Yes, ask: "How painful is it?"   (Scale 1-10; mild, moderate or severe)     6  6. FEVER: "Do you have a fever?" If Yes, ask: "What is it, how was it measured, and when did it start?"      No  7. CAUSE: "What do you think is causing the hand swelling?" (e.g., heat, insect bite, pregnancy, recent injury)     Occurred after cutting her husband and son's hair  8. MEDICAL HISTORY: "Do you have a history of heart failure, kidney disease, liver failure, or cancer?"     No  9. RECURRENT SYMPTOM: "Have you had hand swelling before?" If Yes, ask: "When was the last time?" "What happened that time?"     No  10. OTHER SYMPTOMS: "Do you have any other symptoms?" (e.g., blurred vision, difficulty breathing, headache)       Drained some white pus-like fluid and blood  11. PREGNANCY: "Is there any chance you are pregnant?" "When was your last menstrual period?"       No, LMP 10-15-2023  Protocols used: Hand Swelling-A-AH

## 2023-11-04 NOTE — Telephone Encounter (Signed)
 Will route to Dr. Ermalene Searing who is seeing pt tomorrow also as a Financial planner

## 2023-11-05 ENCOUNTER — Encounter: Payer: Self-pay | Admitting: Family Medicine

## 2023-11-05 ENCOUNTER — Ambulatory Visit: Admitting: Family Medicine

## 2023-11-05 VITALS — BP 102/72 | HR 102 | Temp 99.1°F | Ht 68.0 in | Wt 188.8 lb

## 2023-11-05 DIAGNOSIS — J329 Chronic sinusitis, unspecified: Secondary | ICD-10-CM

## 2023-11-05 DIAGNOSIS — L03011 Cellulitis of right finger: Secondary | ICD-10-CM | POA: Insufficient documentation

## 2023-11-05 DIAGNOSIS — B9789 Other viral agents as the cause of diseases classified elsewhere: Secondary | ICD-10-CM | POA: Insufficient documentation

## 2023-11-05 MED ORDER — CEPHALEXIN 500 MG PO CAPS: 500.0000 mg | ORAL_CAPSULE | Freq: Three times a day (TID) | ORAL | 0 refills | Status: DC

## 2023-11-05 NOTE — Progress Notes (Signed)
 Patient ID: Bethany Perkins, female    DOB: 1990/09/12, 33 y.o.   MRN: [id number]  This visit was conducted in person.  BP 102/72   Pulse (!) 102   Temp 99.1 F (37.3 C) (Oral)   Ht 5\' 8"  (1.727 m)   Wt 188 lb 12.8 oz (85.6 kg)   LMP  (LMP Unknown)   SpO2 97%   BMI 28.71 kg/m    CC:  Chief Complaint  Patient presents with   Finger Swelling    Right middle finger, found a piece of hair removed it and then the swelling occurred. Pus came out yesterday   Sinusitis    Subjective:   HPI: Bethany Perkins is a 33 y.o. female presenting on 11/05/2023 for Finger Swelling (Right middle finger, found a piece of hair removed it and then the swelling occurred. Pus came out yesterday) and Sinusitis     New onset finger swelling at nailbed. Noted after cutting family members hair, found a piece of hair 6 days ago  in her nail edge, removed it. She has been warm compresses/ soaks 2-3 times a day. Applying antibiotic ointment.  Has noted white bloody pus come out.  Cuticle with greenish  coloration  Following this she noted swelling 5 days, pain.   Last night noting nasal congestion, ST, facial pressure  No fever No ear pain.  No cough, no shortness of breath   No antibiotics in last month.    HR elevation typical for pt at  doctor visits.      Relevant past medical, surgical, family and social history reviewed and updated as indicated. Interim medical history since our last visit reviewed. Allergies and medications reviewed and updated. Outpatient Medications Prior to Visit  Medication Sig Dispense Refill   valACYclovir (VALTREX) 1000 MG tablet TAKE 2 TABLETS (2,000 MG TOTAL) BY MOUTH 2 (TWO) TIMES DAILY. 4 tablet 3   No facility-administered medications prior to visit.     Per HPI unless specifically indicated in ROS section below Review of Systems  Constitutional:  Negative for fatigue and fever.  HENT:  Positive for congestion.   Eyes:  Negative for pain.   Respiratory:  Negative for cough and shortness of breath.   Cardiovascular:  Negative for chest pain, palpitations and leg swelling.  Gastrointestinal:  Negative for abdominal pain.  Genitourinary:  Negative for dysuria and vaginal bleeding.  Musculoskeletal:  Negative for back pain.  Neurological:  Negative for syncope, light-headedness and headaches.  Psychiatric/Behavioral:  Negative for dysphoric mood.    Objective:  BP 102/72   Pulse (!) 102   Temp 99.1 F (37.3 C) (Oral)   Ht 5\' 8"  (1.727 m)   Wt 188 lb 12.8 oz (85.6 kg)   LMP  (LMP Unknown)   SpO2 97%   BMI 28.71 kg/m   Wt Readings from Last 3 Encounters:  11/05/23 188 lb 12.8 oz (85.6 kg)  10/01/22 195 lb (88.5 kg)  08/31/21 214 lb 12.8 oz (97.4 kg)      Physical Exam Constitutional:      General: She is not in acute distress.    Appearance: Normal appearance. She is well-developed. She is not ill-appearing or toxic-appearing.  HENT:     Head: Normocephalic.     Right Ear: Hearing, tympanic membrane, ear canal and external ear normal. Tympanic membrane is not erythematous, retracted or bulging.     Left Ear: Hearing, tympanic membrane, ear canal and external ear normal. Tympanic membrane is  not erythematous, retracted or bulging.     Nose: No mucosal edema or rhinorrhea.     Right Sinus: No maxillary sinus tenderness or frontal sinus tenderness.     Left Sinus: No maxillary sinus tenderness or frontal sinus tenderness.     Mouth/Throat:     Pharynx: Uvula midline.  Eyes:     General: Lids are normal. Lids are everted, no foreign bodies appreciated.     Conjunctiva/sclera: Conjunctivae normal.     Pupils: Pupils are equal, round, and reactive to light.  Neck:     Thyroid: No thyroid mass or thyromegaly.     Vascular: No carotid bruit.     Trachea: Trachea normal.  Cardiovascular:     Rate and Rhythm: Normal rate and regular rhythm.     Pulses: Normal pulses.     Heart sounds: Normal heart sounds, S1 normal  and S2 normal. No murmur heard.    No friction rub. No gallop.  Pulmonary:     Effort: Pulmonary effort is normal. No tachypnea or respiratory distress.     Breath sounds: Normal breath sounds. No decreased breath sounds, wheezing, rhonchi or rales.  Abdominal:     General: Bowel sounds are normal.     Palpations: Abdomen is soft.     Tenderness: There is no abdominal tenderness.  Musculoskeletal:     Cervical back: Normal range of motion and neck supple.  Skin:    General: Skin is warm and dry.     Findings: Rash present.     Comments:  See photo   Nml ROM of DIP joint right niddle finger  Neurological:     Mental Status: She is alert.  Psychiatric:        Mood and Affect: Mood is not anxious or depressed.        Speech: Speech normal.        Behavior: Behavior normal. Behavior is cooperative.        Thought Content: Thought content normal.        Judgment: Judgment normal.       Results for orders placed or performed in visit on 12/30/22  HM PAP SMEAR   Collection Time: 12/27/22  9:49 AM  Result Value Ref Range   HM Pap smear NILM     Assessment and Plan  Acute paronychia of finger, right Assessment & Plan: Acute, status post drainage paronychia has improved significantly.  Will treat with antibiotics.  Chose Keflex 500 mg 3 times daily x 7 days given no MRSA past exposure. She will continue warm compresses.  No fluctuance, therefore no indication for  incision and drainage. Return and ER precautions provided.   Viral sinusitis Assessment & Plan: Acute, no sign of bacterial infection.  Most consistent with viral sinusitis. Can use nasal saline as well as Flonase 2 sprays per nostril daily.   Other orders -     Cephalexin; Take 1 capsule (500 mg total) by mouth 3 (three) times daily.  Dispense: 21 capsule; Refill: 0    No follow-ups on file.   Kerby Nora, MD

## 2023-11-05 NOTE — Assessment & Plan Note (Signed)
 Acute, no sign of bacterial infection.  Most consistent with viral sinusitis. Can use nasal saline as well as Flonase 2 sprays per nostril daily.

## 2023-11-05 NOTE — Assessment & Plan Note (Signed)
 Acute, status post drainage paronychia has improved significantly.  Will treat with antibiotics.  Chose Keflex 500 mg 3 times daily x 7 days given no MRSA past exposure. She will continue warm compresses.  No fluctuance, therefore no indication for  incision and drainage. Return and ER precautions provided.

## 2024-03-23 ENCOUNTER — Ambulatory Visit: Payer: Self-pay

## 2024-03-23 ENCOUNTER — Ambulatory Visit: Admitting: Family Medicine

## 2024-03-23 ENCOUNTER — Encounter: Payer: Self-pay | Admitting: Family Medicine

## 2024-03-23 VITALS — BP 98/70 | HR 71 | Temp 98.0°F | Ht 68.0 in | Wt 189.5 lb

## 2024-03-23 DIAGNOSIS — R3915 Urgency of urination: Secondary | ICD-10-CM | POA: Insufficient documentation

## 2024-03-23 LAB — POC URINALSYSI DIPSTICK (AUTOMATED)
Bilirubin, UA: NEGATIVE
Blood, UA: NEGATIVE
Glucose, UA: NEGATIVE
Ketones, UA: NEGATIVE
Leukocytes, UA: NEGATIVE
Nitrite, UA: NEGATIVE
Protein, UA: NEGATIVE
Spec Grav, UA: 1.01 (ref 1.010–1.025)
Urobilinogen, UA: 0.2 U/dL
pH, UA: 6 (ref 5.0–8.0)

## 2024-03-23 MED ORDER — SULFAMETHOXAZOLE-TRIMETHOPRIM 800-160 MG PO TABS: 1.0000 | ORAL_TABLET | Freq: Two times a day (BID) | ORAL | 0 refills | Status: AC

## 2024-03-23 NOTE — Progress Notes (Signed)
 Patient ID: Bethany Perkins, female    DOB: 05/07/1991, 33 y.o.   MRN: [id number]  This visit was conducted in person.  BP 98/70   Pulse 71   Temp 98 F (36.7 C) (Temporal)   Ht 5' 8 (1.727 m)   Wt 189 lb 8 oz (86 kg)   LMP 03/07/2024   SpO2 99%   BMI 28.81 kg/m    CC:  Chief Complaint  Patient presents with   Urinary Urgency    Subjective:   HPI: Bethany Perkins is a 33 y.o. female presenting on 03/23/2024 for Urinary Urgency   She has noted increase in urinary urgency, dysuria, urine with odor and cloudy.4 days a go  Increase in urinary frequency, small amount.  Low abdominal pain  No fever  No flank pain.   Had sex last week.. symptoms started after   09/2022 [facility]     Has been tretain ibuprofen  and water .  Relevant past medical, surgical, family and social history reviewed and updated as indicated. Interim medical history since our last visit reviewed. Allergies and medications reviewed and updated. Outpatient Medications Prior to Visit  Medication Sig Dispense Refill   valACYclovir  (VALTREX ) 1000 MG tablet TAKE 2 TABLETS (2,000 MG TOTAL) BY MOUTH 2 (TWO) TIMES DAILY. 4 tablet 3   cephALEXin  (KEFLEX ) 500 MG capsule Take 1 capsule (500 mg total) by mouth 3 (three) times daily. 21 capsule 0   No facility-administered medications prior to visit.     Per HPI unless specifically indicated in ROS section below Review of Systems  Constitutional:  Negative for fatigue and fever.  HENT:  Negative for congestion.   Eyes:  Negative for pain.  Respiratory:  Negative for cough and shortness of breath.   Cardiovascular:  Negative for chest pain, palpitations and leg swelling.  Gastrointestinal:  Negative for abdominal pain.  Genitourinary:  Negative for dysuria and vaginal bleeding.  Musculoskeletal:  Negative for back pain.  Neurological:  Negative for syncope, light-headedness and headaches.  Psychiatric/Behavioral:  Negative for dysphoric mood.     Objective:  BP 98/70   Pulse 71   Temp 98 F (36.7 C) (Temporal)   Ht 5' 8 (1.727 m)   Wt 189 lb 8 oz (86 kg)   LMP 03/07/2024   SpO2 99%   BMI 28.81 kg/m   Wt Readings from Last 3 Encounters:  03/23/24 189 lb 8 oz (86 kg)  11/05/23 188 lb 12.8 oz (85.6 kg)  10/01/22 195 lb (88.5 kg)      Physical Exam Constitutional:      General: She is not in acute distress.    Appearance: Normal appearance. She is well-developed. She is not ill-appearing or toxic-appearing.  HENT:     Head: Normocephalic.     Right Ear: Hearing, tympanic membrane, ear canal and external ear normal. Tympanic membrane is not erythematous, retracted or bulging.     Left Ear: Hearing, tympanic membrane, ear canal and external ear normal. Tympanic membrane is not erythematous, retracted or bulging.     Nose: No mucosal edema or rhinorrhea.     Right Sinus: No maxillary sinus tenderness or frontal sinus tenderness.     Left Sinus: No maxillary sinus tenderness or frontal sinus tenderness.     Mouth/Throat:     Pharynx: Uvula midline.  Eyes:     General: Lids are normal. Lids are everted, no foreign bodies appreciated.     Conjunctiva/sclera: Conjunctivae normal.     Pupils:  Pupils are equal, round, and reactive to light.  Neck:     Thyroid: No thyroid mass or thyromegaly.     Vascular: No carotid bruit.     Trachea: Trachea normal.  Cardiovascular:     Rate and Rhythm: Normal rate and regular rhythm.     Pulses: Normal pulses.     Heart sounds: Normal heart sounds, S1 normal and S2 normal. No murmur heard.    No friction rub. No gallop.  Pulmonary:     Effort: Pulmonary effort is normal. No tachypnea or respiratory distress.     Breath sounds: Normal breath sounds. No decreased breath sounds, wheezing, rhonchi or rales.  Abdominal:     General: Bowel sounds are normal.     Palpations: Abdomen is soft.     Tenderness: There is no abdominal tenderness.  Musculoskeletal:     Cervical back: Normal  range of motion and neck supple.  Skin:    General: Skin is warm and dry.     Findings: No rash.  Neurological:     Mental Status: She is alert.  Psychiatric:        Mood and Affect: Mood is not anxious or depressed.        Speech: Speech normal.        Behavior: Behavior normal. Behavior is cooperative.        Thought Content: Thought content normal.        Judgment: Judgment normal.       Results for orders placed or performed in visit on 03/23/24  POCT Urinalysis Dipstick (Automated)   Collection Time: 03/23/24 12:28 PM  Result Value Ref Range   Color, UA Yellow    Clarity, UA Clear    Glucose, UA Negative Negative   Bilirubin, UA Negative    Ketones, UA Negative    Spec Grav, UA 1.010 1.010 - 1.025   Blood, UA Negative    pH, UA 6.0 5.0 - 8.0   Protein, UA Negative Negative   Urobilinogen, UA 0.2 0.2 or 1.0 E.U./dL   Nitrite, UA Negative    Leukocytes, UA Negative Negative    Assessment and Plan  Urinary urgency Assessment & Plan: Acute, symptoms very characteristic of urinary tract infection but urinalysis is clear in office today.  I wonder if urinalysis appears clear given she is very well-hydrated and sample is diluted.  Will send urine for culture but treat empirically with Bactrim  double strength 1 tablet twice daily x 3 days.  She will continue pushing fluids and will avoid bladder irritants.  Return and ER precautions provided.  Orders: -     POCT Urinalysis Dipstick (Automated)  Other orders -     Sulfamethoxazole -Trimethoprim ; Take 1 tablet by mouth 2 (two) times daily.  Dispense: 6 tablet; Refill: 0    No follow-ups on file.   Greig Ring, MD

## 2024-03-23 NOTE — Assessment & Plan Note (Signed)
 Acute, symptoms very characteristic of urinary tract infection but urinalysis is clear in office today.  I wonder if urinalysis appears clear given she is very well-hydrated and sample is diluted.  Will send urine for culture but treat empirically with Bactrim  double strength 1 tablet twice daily x 3 days.  She will continue pushing fluids and will avoid bladder irritants.  Return and ER precautions provided.

## 2024-03-23 NOTE — Telephone Encounter (Signed)
 FYI Only or Action Required?: FYI only for provider.  Patient was last seen in primary care on 11/05/2023 by Avelina Greig BRAVO, MD.  Called Nurse Triage reporting Dysuria.  Symptoms began several days ago.  Interventions attempted: Rest, hydration, or home remedies.  Symptoms are: gradually worsening.  Triage Disposition: See HCP Within 4 Hours (Or PCP Triage)  Patient/caregiver understands and will follow disposition?: Yes     Summary: UTI   Patient is calling in for an UTI         Reason for Disposition  [1] SEVERE pain with urination (e.g., excruciating) AND [2] not improved after 2 hours of pain medicine  Answer Assessment - Initial Assessment Questions 1. SEVERITY: How bad is the pain?  (e.g., Scale 1-10; mild, moderate, or severe)     Moderate  3. PATTERN: Is pain present every time you urinate or just sometimes?      Every urination  4. ONSET: When did the painful urination start?      3 days ago 5. FEVER: Do you have a fever? If Yes, ask: What is your temperature, how was it measured, and when did it start?     No 6. PAST UTI: Have you had a urine infection before? If Yes, ask: When was the last time? and What happened that time?      Yes, feels the same  7. CAUSE: What do you think is causing the painful urination?  (e.g., UTI, scratch, Herpes sore)     UTI 8. OTHER SYMPTOMS: Do you have any other symptoms? (e.g., blood in urine, flank pain, genital sores, urgency, vaginal discharge)     Increased urgency, foul odor to urine, cloudy urine  9. PREGNANCY: Is there any chance you are pregnant? When was your last menstrual period?     No  Protocols used: Urination Pain - Female-A-AH

## 2024-07-27 ENCOUNTER — Ambulatory Visit (INDEPENDENT_AMBULATORY_CARE_PROVIDER_SITE_OTHER): Admitting: Family Medicine

## 2024-07-27 ENCOUNTER — Encounter (INDEPENDENT_AMBULATORY_CARE_PROVIDER_SITE_OTHER): Payer: Self-pay | Admitting: Family Medicine

## 2024-07-27 VITALS — BP 123/83 | HR 104 | Temp 98.2°F | Ht 67.5 in | Wt 192.0 lb

## 2024-07-27 DIAGNOSIS — E781 Pure hyperglyceridemia: Secondary | ICD-10-CM

## 2024-07-27 DIAGNOSIS — F411 Generalized anxiety disorder: Secondary | ICD-10-CM

## 2024-07-27 DIAGNOSIS — Z0289 Encounter for other administrative examinations: Secondary | ICD-10-CM

## 2024-07-27 NOTE — Progress Notes (Signed)
 Bethany DOROTHA Jenkins, DO, ABFM, ABOM Bariatric physician [address] [place], [place], [place] [address] Office: [phone]  /  Fax: [phone]     Initial Evaluation:  Bethany Perkins was seen in clinic today to evaluate for obesity. She is interested in losing weight to improve overall health and reduce the risk of weight related complications. She presents today to review program treatment options, initial physical assessment, and evaluation.      She was referred by: Self-Referral  When asked what they hope to accomplish? She states: adopt a healthier eating patterns and needs consistency  When asked how has your weight affected you? She states: Having fatigue, Having poor endurance, Problems with eating patterns, and Has affected mood   Contributing factors to her weight change: consumption of processed foods and reduced physical activity, life event (having children)   Some associated conditions: Hyperlipidemia, GERD, Lung disease, and Other: GAD  Current nutrition plan: None  Current level of physical activity: None  Current or previous pharmacotherapy: None  Response to medication: Never tried medications   Barriers to weight loss that patient expresses a concern about today: lack of time for self-care, having difficulty with meal prep and planning, difficulty implementing reduced calorie nutrition plan, need for convenience or prepackaged foods, and emotional eating.     Past Medical History:  Diagnosis Date   Allergy    allergic rhinitis seasonal   Anxiety    in high school   Asthma    mild   Dysmenorrhea    Fx wrist 06/2003    Current Outpatient Medications  Medication Instructions   sulfamethoxazole -trimethoprim  (BACTRIM  DS) 800-160 MG tablet 1 tablet, Oral, 2 times daily   valACYclovir  (VALTREX ) 2,000 mg, Oral, 2 times daily     No Known Allergies   Past Surgical History:  Procedure Laterality Date   CESAREAN SECTION N/A 04/07/2015   Procedure:  CESAREAN SECTION;  Surgeon: Truman Corona, MD;  Location: [facility];  Service: Obstetrics;  Laterality: N/A;   CESAREAN SECTION N/A 11/25/2017   Procedure: CESAREAN SECTION;  Surgeon: Corona Truman, MD;  Location: [facility];  Service: Obstetrics;  Laterality: N/A;  Repeat edc 11/27/17 nkda need RNFA - HK to RNFA   CESAREAN SECTION WITH BILATERAL TUBAL LIGATION N/A 08/31/2021   Procedure: REPEAT CESAREAN SECTION WITH BILATERAL TUBAL LIGATION EDC: 09-07-21 ALLEG: NKDA  PREVIOUS X 2;  Surgeon: Marne Kelly Nest, MD;  Location: [facility];  Service: Obstetrics;  Laterality: N/A;   TONSILLECTOMY     WISDOM TOOTH EXTRACTION       Family History  Problem Relation Age of Onset   Cancer Maternal Grandfather        unknown tumor   Heart disease Maternal Grandfather    Hypertension Maternal Grandfather    Diabetes Maternal Grandfather    Cancer Paternal Grandmother        breast   Hypertension Paternal Grandmother    Dementia Paternal Grandmother    Diabetes Paternal Grandmother      Objective:  BP 123/83   Pulse (!) 104   Temp 98.2 F (36.8 C)   Ht 5' 7.5 (1.715 m)   Wt 192 lb (87.1 kg)   SpO2 99%   BMI 29.63 kg/m  She was weighed on the bioimpedance scale: Body mass index is 29.63 kg/m.  Visceral Fat rating : 6, Body Fat %:36.4  Weight Lost Since Last Visit: N/a  Weight Gained Since Last Visit: N/a   Vitals Temp: 98.2 F (36.8 C)  BP: 123/83 Pulse Rate: (!) 104 SpO2: 99 %   Anthropometric Measurements Height: 5' 7.5 (1.715 m) Weight: 192 lb (87.1 kg) BMI (Calculated): 29.61 Weight at Last Visit: N/a Weight Lost Since Last Visit: N/a Weight Gained Since Last Visit: N/a Starting Weight: N/a Total Weight Loss (lbs): 0 lb (0 kg) Peak Weight: 220lb   Body Composition  Body Fat %: 36.4 % Fat Mass (lbs): 70.2 lbs Muscle Mass (lbs): 116.4 lbs Total Body Water  (lbs): 78.6 lbs Visceral Fat Rating : 6   Other Clinical Data Fasting: No Labs: No Today's  Visit #: 0 Comments: Consult     General: Well Developed, well nourished, and in no acute distress.  HEENT: Normocephalic, atraumatic; EOMI, sclerae are anicteric. Skin: Warm and dry, good turgor Chest:  Normal excursion, shape, no gross ABN Respiratory: No conversational dyspnea; speaking in full sentences NeuroM-Sk:  Normal gross ROM * 4 extremities  Psych: A and O *3, insight adequate, mood- full    Assessment and Plan:   FOR THE DISEASE OF OBESITY:  Assessment & Plan: We reviewed anthropometrics, biometrics, associated medical conditions and contributing factors with patient. Bethany Perkins would benefit from a medically tailored reduced calorie nutrional plan based on their REE (resting energy expenditure), which will be determined by indirect calorimetry.  We will also assess for cardiometabolic risk and nutritional derangements via fasting labs at intake appointment.    Obesity Treatment / Action Plan:   she was weighed on the bioimpedance scale and results were discussed and documented in the synopsis.   Bethany Perkins will complete provided nutritional and psychosocial assessment questionnaire before the next appointment.  she will be scheduled for indirect calorimetry to determine resting energy expenditure in a fasting state.  This will allow us  to create a reduced calorie, high-protein meal plan to promote loss of fat mass while preserving muscle mass.  We will also assess for cardiometabolic risk and nutritional derangements via an ECG and fasting serologies at her next appointment.  she was encouraged to work on amassing support from family and friends to begin their weight loss journey.   Work on eliminating or reducing the presence of highly processed, poorly nutritious, calorie-dense foods in the home.   Obesity Education Performed Today:  Patient was counseled on nutritional approaches to weight loss and benefits of reducing processed foods and consuming  plant-based foods and high quality protein as part of nutritional weight management program.   We discussed the importance of long term lifestyle changes which include nutrition, exercise and behavioral modifications as well as the importance of customizing this to her specific health and social needs.   We discussed the benefits of reaching a healthier weight to alleviate the symptoms of existing conditions and reduce the risks of the biomechanical, metabolic and psychological effects of obesity.  Was counseled on the health benefits of losing 5%-10% of total body weight.  Was counseled on our cognitive behavorial therapy program, lead by our bariatric psychologist, who focuses on emotional eating and creating positive behavorial change.  Was counseled on bariatric pharmacotherapy and how this may be used as an adjunct in their weight management    Bethany Perkins appears to be in the action stage of change and states they are ready to start intensive lifestyle modifications and behavioral modifications.  It was recommended that she follow up in the next 1-2 weeks to review the above steps, and to continue with treatment of their chronic disease state of obesity   FOR OTHER CONDITIONS RELATED  TO THE DISEASE OF OBESITY:  Hypertriglyceridemia Assessment & Plan Lab Results  Component Value Date   CHOL 246 (H) 07/12/2020   HDL 37.90 (L) 07/12/2020   LDLDIRECT 128.0 07/12/2020   TRIG (H) 07/12/2020    414.0 Triglyceride is over 400; calculations on Lipids are invalid.   CHOLHDL 6 07/12/2020    Patient states that she is not currently on any medications. She states that in her last labs in 2021 her PCP tried to put her on medications and she denied being put on medications. She reports that she wanted to control it with diet and lifestyle changes. She has not had labs drawn since 2021. Bethany Perkins would benefit from this program that has a meal plan that is low in trans and saturated fats.      ANXIETY DISORDER Assessment & Plan Patient is not currently on any medications. She endorses that her mood/anxiety has recently not been well controlled. She reports that she was previously on anti-anxiety medication when she was in middle school and felt that it made her anxiety worse so she stopped the medication. Bethany Perkins would benefit from this program because obesity can increase anxiety and losing weight can help lessen the anxiety and regain control.       Attestations:   Bethany Perkins, acting as a medical scribe for Bethany Jenkins, DO., have compiled all relevant documentation for today's office visit on behalf of Bethany Jenkins, DO, while in the presence of Bethany & Mclennan, DO.  I have spent 44 minutes in the care of the patient today.  34 minutes was spent in face to face counseling of the patient on the disease of obesity and what our program can do for their medical conditions as well as in preventing future diseases. I discussed the importance of comprehensive care in the treatment of obesity including mental well being and physical activity. 10 minutes was spent on pre-chart review and additional post visit documentation.   I have reviewed the above documentation for accuracy and completeness, and I agree with the above. Bethany Perkins, D.O.  The 21st Century Cures Act was signed into law in 2016 which includes the topic of electronic health records.  This provides immediate access to information in MyChart.  This includes consultation notes, operative notes, office notes, lab results and pathology reports.  If you have any questions about what you read please let us  know at your next visit so we can discuss your concerns and take corrective action if need be.  We are right here with you!

## 2024-08-27 ENCOUNTER — Encounter: Admitting: Family Medicine

## 2024-09-07 ENCOUNTER — Encounter (INDEPENDENT_AMBULATORY_CARE_PROVIDER_SITE_OTHER): Payer: Self-pay | Admitting: Family Medicine

## 2024-09-07 ENCOUNTER — Ambulatory Visit (INDEPENDENT_AMBULATORY_CARE_PROVIDER_SITE_OTHER): Admitting: Family Medicine

## 2024-09-07 VITALS — BP 105/73 | HR 80 | Temp 98.4°F | Ht 67.5 in | Wt 193.0 lb

## 2024-09-07 DIAGNOSIS — R5383 Other fatigue: Secondary | ICD-10-CM

## 2024-09-07 DIAGNOSIS — Z6829 Body mass index (BMI) 29.0-29.9, adult: Secondary | ICD-10-CM | POA: Diagnosis not present

## 2024-09-07 DIAGNOSIS — Z1331 Encounter for screening for depression: Secondary | ICD-10-CM | POA: Diagnosis not present

## 2024-09-07 DIAGNOSIS — F419 Anxiety disorder, unspecified: Secondary | ICD-10-CM

## 2024-09-07 DIAGNOSIS — F5089 Other specified eating disorder: Secondary | ICD-10-CM | POA: Diagnosis not present

## 2024-09-07 DIAGNOSIS — R0602 Shortness of breath: Secondary | ICD-10-CM | POA: Diagnosis not present

## 2024-09-07 DIAGNOSIS — E781 Pure hyperglyceridemia: Secondary | ICD-10-CM | POA: Diagnosis not present

## 2024-09-07 DIAGNOSIS — F411 Generalized anxiety disorder: Secondary | ICD-10-CM

## 2024-09-07 DIAGNOSIS — E66811 Obesity, class 1: Secondary | ICD-10-CM

## 2024-09-07 NOTE — Progress Notes (Signed)
 "  Bethany Perkins, D.O.  ABFM, ABOM Specializing in Clinical Bariatric Medicine Office located at: [address]  [place], [place]  [address]   Bariatric Medicine Visit  Dear Bethany, Bethany LABOR, MD   Thank you for referring Bethany Perkins to our clinic today for evaluation.  We performed a consultation to discuss her options for treatment and educate the patient on her disease state.  The following note includes my evaluation and treatment recommendations.   Please do not hesitate to reach out to me directly if you have any further concerns.    Assessment and Plan:   Orders Placed This Encounter  Procedures   Vitamin B12   TSH   T4, free   T3   VITAMIN D  25 Hydroxy (Vit-D Deficiency, Fractures)   Magnesium   Lipid panel   Hemoglobin A1c   Insulin , random   Folate   Comprehensive metabolic panel with GFR   CBC with Differential/Platelet   EKG 12-Lead    FOR THE DISEASE OF OBESITY:  Assessment & Plan: Bethany Perkins is currently in the action stage of change. As such, her goal is to start our weight management plan.  She has agreed to implement: follow the Category 2 plan with 100 snack calories  Behavioral Intervention We discussed the following Behavioral Modification Strategies today: increasing lean protein intake to established goals, increasing lower glycemic fruits, avoiding skipping meals, keeping healthy foods at home, work on managing stress, creating time for self-care and relaxation, and avoiding temptations and identifying enticing environmental cues  Additional resources provided today: Handout on CAT 2 meal plan, Handout on CAT 1-2 breakfast options, and Handout on CAT 1-2 lunch optionsHandout on Mental Health Resources  Evidence-based interventions for health behavior change were utilized today including the discussion of self monitoring techniques, problem-solving barriers and SMART goal setting techniques.    Goal(s) for next OV: weight lean proteins  and measure veggies    Recommended Physical Activity Goals Bethany Perkins has been advised to work up to 300-450  minutes of moderate intensity aerobic activity a week and strengthening exercises 2-3 times per week for cardiovascular health, weight loss maintenance and preservation of muscle mass.   She has agreed to : maintain current level of activity.    Pharmacotherapy We discussed various medication options to help Bethany Perkins with her weight loss efforts and we both agreed to: Begin with nutritional and behavioral strategies   ASSOCIATED CONDITIONS ADDRESSED TODAY:  Fatigue Assessment & Plan: Bethany Perkins does feel that her weight is causing her energy to be lower than it should be. Fatigue may be related to obesity, depression or many other causes. she does not appear to have any red flag symptoms and this appears to most likely be related to her current lifestyle habits and dietary intake.  Labs will be ordered and reviewed with her at their next office visit in two weeks.  - Patient states that she feels more tired that she has felt in her life and she is not sure why she has significant fatigue. Her ESS is on the cusp but as of right now we will not refer her to a sleep study but if her eating more healthy and improving her life style doesn't;t improve her fatigue we will refer her.   Epworth sleepiness scale is 9 and appears to be within normal limits. Bethany Perkins denies daytime somnolence and reports waking up still tired. Patient has a history of symptoms of morning headache. Bethany Perkins generally gets 5 or 6  hours of sleep per night, and states that she has difficulty falling asleep. Snoring is present. Apneic episodes is not present.   ECG: Performed and reviewed/ interpreted independently.  Normal sinus rhythm, rate 77 bpm; reassuring without any acute abnormalities, will continue to monitor for symptoms     Shortness of breath on exertion Assessment & Plan: Bethany Perkins does feel that she gets out  of breath more easily than she used to when she exercises and seems to be worsening over time with weight gain.  This has gotten worse recently. Bethany Perkins denies shortness of breath at rest or orthopnea. Pt denies chest pain, dizziness, heart palpitations, or excessive diaphoresis or nausea with activity.  This is not new and is ongoing.  Bethany Perkins shortness of breath appears to be obesity related and exercise induced, as they do not appear to have any red flag symptoms/ concerns today.  Also, this condition appears to be related to a state of poor cardiovascular conditioning   Obtain labs today and will be reviewed with her at their next office visit in two weeks.  Indirect Calorimeter completed today to help guide our dietary regimen. It shows a VO2 of 2056 and a REE of 1771.  Her calculated basal metabolic rate is 8301 thus her resting energy expenditure is better than expected.  Patient agreed to work on weight loss at this time.  As Bethany Perkins progresses through our weight loss program, we will gradually increase exercise as tolerated to treat her current condition.   If Bethany Perkins follows our recommendations and loses 5-10% of their weight without improvement of her shortness of breath or if at any time, symptoms become more concerning, they agree to urgently follow up with their PCP/ specialist for further consideration/ evaluation.   Bethany Perkins verbalizes agreement with this plan.     ANXIETY DISORDER- emotional eating Depression Screen  Assessment & Plan: Her PHQ-9 score was 7 today. Making life Somewhat difficult. Patient denies any SI/HI. She reports that her depression was more in the past. Patient states that in her middle age school she has some self harm self cutting and she went to a counselor at that time. During that time they attempted to put her on medication at the time but she did not take the medication. Since then she states that she has not seen a counselor or been on any medication.  Extensive discussion with patient about the importance of having a counselor and seeing someone about the anxiety and how they can help. Patient states that she is picky about who she sees as she is a Christian and would like to see someone who is a Curator. She is currently in a mentor program and is using that to talk to them. Handout on mental health resources. She will consider reaching out to someone but will think about it for now. Patient reports eating when stressed, sad, as a reward and to help comfort herself. Continue building strategies with people around you and reaching out to people and talking to people. Will reassess at next OV.     Hypertriglyceridemia Assessment & Plan Lab Results  Component Value Date   CHOL 246 (H) 07/12/2020   HDL 37.90 (L) 07/12/2020   LDLDIRECT 128.0 07/12/2020   TRIG (H) 07/12/2020    414.0 Triglyceride is over 400; calculations on Lipids are invalid.   CHOLHDL 6 07/12/2020   Diet and life style controlled. Patients HDL levels are below goal. Will obtain labs today and review at next OV. Begin exercise  and following meal plan and decreasing saturated and trans fat.     FOLLOW UP:   Follow up in 2 weeks. She was informed of the importance of frequent follow up visits to maximize her success with intensive lifestyle modifications for her multiple health conditions.  Bethany Perkins is aware that we will review all of her lab results at our next visit.  She is aware that if anything is critical/ life threatening with the results, we will be contacting her via MyChart prior to the office visit to discuss management.     Chief Complaint:   OBESITY Bethany Perkins (MR# [id number]) is a pleasant 34 y.o. female who presents for evaluation and treatment of obesity and related comorbidities. Current BMI is Body mass index is 29.78 kg/m. Bethany Perkins has been struggling with her weight for many years and has been unsuccessful in either  losing weight, maintaining weight loss, or reaching her healthy weight goal.  Bethany Perkins is currently in the action stage of change and ready to dedicate time achieving and maintaining a healthier weight. Bethany Perkins is interested in becoming our patient and working on intensive lifestyle modifications including (but not limited to) diet and exercise for weight loss.  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 Patient is married and has children. She lives with her husband and children.  - Patient does not exercise.  - She wears a fitness tracker and gets about 4000 steps a day  - Desired to be 135 lbs in 2-3 years  - Wants to be her pre marriage weight   - Wants to loose weight so she can have more energy, have reduced stress/anxiety, gain strength/endurance and set a good example for family/kids  - Heaviest weight is 203 lbs   - Started gaining weight after having children  - Tried [employer] and weight watchers in the past but nothing worked because she got overwhelmed with tracking and lost motivation after eating something she shouldn't.  - Has an all or nothing personality.  - Eats outside of home 7- 8 times a week.  - Eats fast food or take out 5 times a week.   - Enjoys cooking but doesn't have time.  - Craves soda, chocolate and hibachi.  - Craves at dinner.  - Doesn't snack at night   - Snacks on cheese sticks, cookies, chips or small amounts of chocolate.   - Skips breakfast or lunch almost daily  - Drinks Caloric Beverages like: Sweet tea with sugar, apple juice, orange juice, fruit smoothies, no alcohol and does no use sugar substitutes.  - Worst food habits: is not eating something nourishing when hungry and skipping meals.  - Struggles with poor food choices and portion control and denies excessive hunger.   - Eats until stuffed because she eats only 1 meal a day.    Subjective:   This is the patient's  first visit at [facility].  The patient's NEW PATIENT PACKET that they filled out prior to today's office visit was reviewed at length and information from that paperwork was included within the following office visit note.    Included in the packet: current and past health history, medications, allergies, ROS, gynecologic history (women only), surgical history, family history, social history, weight history, weight loss surgery history (for those that have had weight loss surgery), nutritional evaluation, mood and food questionnaire along with a depression screening (PHQ9) on all patients,  an Epworth questionnaire, sleep habits questionnaire, patient life and health improvement goals questionnaire. These will all be scanned into the patient's chart under the media tab.   Review of Systems: Please refer to new patient packet scanned into media. Pertinent positives were addressed with patient today.  Reviewed by clinician on day of visit: allergies, medications, problem list, medical history, surgical history, family history, social history, and previous encounter notes.  During the visit, I independently reviewed the patient's EKG, bioimpedance scale results, and indirect calorimeter results. I used this information to tailor a meal plan for the patient that will help Bethany Perkins to lose weight and will improve her obesity-related conditions going forward.  I performed a medically necessary appropriate examination and/or evaluation. I discussed the assessment and treatment plan with the patient. The patient was provided an opportunity to ask questions and all were answered. The patient agreed with the plan and demonstrated an understanding of the instructions. Labs were ordered today (unless patient declined them) and will be reviewed with the patient at our next visit unless more critical results need to be addressed immediately. Clinical information was updated and documented in  the EMR.    Objective:   PHYSICAL EXAM: Blood pressure 105/73, pulse 80, temperature 98.4 F (36.9 C), height 5' 7.5 (1.715 m), weight 193 lb (87.5 kg), last menstrual period 08/29/2024, SpO2 99%. Body mass index is 29.78 kg/m.  General: Well Developed, well nourished, and in no acute distress.  HEENT: Normocephalic, atraumatic; EOMI, sclerae are anicteric. Skin: Warm and dry, good turgor Chest:  Normal excursion, shape, no gross ABN Respiratory: No conversational dyspnea; speaking in full sentences NeuroM-Sk:  Normal gross ROM * 4 extremities  Psych: A and O *3, insight adequate, mood- full   Anthropometric Measurements Height: 5' 7.5 (1.715 m) Weight: 193 lb (87.5 kg) BMI (Calculated): 29.76 Weight at Last Visit: 0 Weight Lost Since Last Visit: 0 Weight Gained Since Last Visit: 0 Starting Weight: 193lb Total Weight Loss (lbs): 0 lb (0 kg) Peak Weight: 203lb Waist Measurement : 39.5 inches   Body Composition  Body Fat %: 36.7 % Fat Mass (lbs): 71.2 lbs Muscle Mass (lbs): 116.4 lbs Total Body Water  (lbs): 78.6 lbs Visceral Fat Rating : 6   Other Clinical Data RMR: [id number] Fasting: yes Labs: Yes Today's Visit #: 1 Starting Date: 09/07/24 Comments: New Patient    DIAGNOSTIC DATA REVIEWED:  BMET    Component Value Date/Time   NA 135 05/03/2020 0801   K 4.7 05/03/2020 0801   CL 101 05/03/2020 0801   CO2 26 05/03/2020 0801   GLUCOSE 94 05/03/2020 0801   BUN 12 05/03/2020 0801   CREATININE 0.78 05/03/2020 0801   CALCIUM 9.3 05/03/2020 0801   No results found for: HGBA1C No results found for: INSULIN  Lab Results  Component Value Date   TSH 2.39 05/03/2020   CBC    Component Value Date/Time   WBC 15.9 (H) 09/01/2021 0449   RBC 3.46 (L) 09/01/2021 0449   HGB 9.2 (L) 09/01/2021 0449   HCT 28.9 (L) 09/01/2021 0449   PLT 251 09/01/2021 0449   MCV 83.5 09/01/2021 0449   MCH 26.6 09/01/2021 0449   MCHC 31.8 09/01/2021 0449   RDW 15.1 09/01/2021  0449   Iron  Studies No results found for: IRON , TIBC, FERRITIN, IRONPCTSAT Lipid Panel     Component Value Date/Time   CHOL 246 (H) 07/12/2020 0800   TRIG (H) 07/12/2020 0800    414.0 Triglyceride is over 400;  calculations on Lipids are invalid.   HDL 37.90 (L) 07/12/2020 0800   CHOLHDL 6 07/12/2020 0800   LDLDIRECT 128.0 07/12/2020 0800   Hepatic Function Panel     Component Value Date/Time   PROT 6.6 05/03/2020 0801   ALBUMIN 4.1 05/03/2020 0801   AST 12 05/03/2020 0801   ALT 11 05/03/2020 0801   ALKPHOS 56 05/03/2020 0801   BILITOT 0.4 05/03/2020 0801      Component Value Date/Time   TSH 2.39 05/03/2020 0801   Nutritional No results found for: VD25OH  Attestation Statements:   ISonny Laroche, acting as a medical scribe for Bethany Jenkins, DO., have compiled all relevant documentation for today's office visit on behalf of Bethany Jenkins, DO, while in the presence of [employer], DO.  I have spent 60 minutes in the care of the patient today including: 35 minutes face-to-face assessing and reviewing listed medical problems above as outlined in office visit note, providing nutritional and behavioral counseling as outlined in obesity care plan, independently interpreting results and goals of care, see listed medical problems, and discussing biometric information and progress. 25 minutes was spent on review of chart/new patient packet and additional post-visit documentation.  I have reviewed the above documentation for accuracy and completeness, and I agree with the above. Bethany JINNY Perkins, D.O.  The 21st Century Cures Act was signed into law in 2016 which includes the topic of electronic health records.  This provides immediate access to information in MyChart.  This includes consultation notes, operative notes, office notes, lab results and pathology reports.  If you have any questions about what you read please let us  know at your next visit so we can  discuss your concerns and take corrective action if need be.  We are right here with you. "

## 2024-09-08 LAB — CBC WITH DIFFERENTIAL/PLATELET
Basophils Absolute: 0.1 x10E3/uL (ref 0.0–0.2)
Basos: 1 %
EOS (ABSOLUTE): 0.2 x10E3/uL (ref 0.0–0.4)
Eos: 2 %
Hematocrit: 42.1 % (ref 34.0–46.6)
Hemoglobin: 13.7 g/dL (ref 11.1–15.9)
Immature Grans (Abs): 0 x10E3/uL (ref 0.0–0.1)
Immature Granulocytes: 0 %
Lymphocytes Absolute: 1.9 x10E3/uL (ref 0.7–3.1)
Lymphs: 24 %
MCH: 28.6 pg (ref 26.6–33.0)
MCHC: 32.5 g/dL (ref 31.5–35.7)
MCV: 88 fL (ref 79–97)
Monocytes Absolute: 0.5 x10E3/uL (ref 0.1–0.9)
Monocytes: 6 %
Neutrophils Absolute: 5.3 x10E3/uL (ref 1.4–7.0)
Neutrophils: 67 %
Platelets: 418 x10E3/uL (ref 150–450)
RBC: 4.79 x10E6/uL (ref 3.77–5.28)
RDW: 13.6 % (ref 11.7–15.4)
WBC: 7.9 x10E3/uL (ref 3.4–10.8)

## 2024-09-08 LAB — COMPREHENSIVE METABOLIC PANEL WITH GFR
ALT: 12 IU/L (ref 0–32)
AST: 13 IU/L (ref 0–40)
Albumin: 4.6 g/dL (ref 3.9–4.9)
Alkaline Phosphatase: 74 IU/L (ref 41–116)
BUN/Creatinine Ratio: 16 (ref 9–23)
BUN: 12 mg/dL (ref 6–20)
Bilirubin Total: 0.4 mg/dL (ref 0.0–1.2)
CO2: 22 mmol/L (ref 20–29)
Calcium: 9.6 mg/dL (ref 8.7–10.2)
Chloride: 104 mmol/L (ref 96–106)
Creatinine, Ser: 0.76 mg/dL (ref 0.57–1.00)
Globulin, Total: 2.5 g/dL (ref 1.5–4.5)
Glucose: 95 mg/dL (ref 70–99)
Potassium: 5.2 mmol/L (ref 3.5–5.2)
Sodium: 141 mmol/L (ref 134–144)
Total Protein: 7.1 g/dL (ref 6.0–8.5)
eGFR: 107 mL/min/1.73

## 2024-09-08 LAB — LIPID PANEL
Chol/HDL Ratio: 6 ratio — ABNORMAL HIGH (ref 0.0–4.4)
Cholesterol, Total: 238 mg/dL — ABNORMAL HIGH (ref 100–199)
HDL: 40 mg/dL
LDL Chol Calc (NIH): 167 mg/dL — ABNORMAL HIGH (ref 0–99)
Triglycerides: 166 mg/dL — ABNORMAL HIGH (ref 0–149)
VLDL Cholesterol Cal: 31 mg/dL (ref 5–40)

## 2024-09-08 LAB — MAGNESIUM: Magnesium: 2 mg/dL (ref 1.6–2.3)

## 2024-09-08 LAB — HEMOGLOBIN A1C
Est. average glucose Bld gHb Est-mCnc: 105 mg/dL
Hgb A1c MFr Bld: 5.3 % (ref 4.8–5.6)

## 2024-09-08 LAB — TSH: TSH: 1.56 u[IU]/mL (ref 0.450–4.500)

## 2024-09-08 LAB — FOLATE: Folate: 14.8 ng/mL

## 2024-09-08 LAB — VITAMIN B12: Vitamin B-12: 540 pg/mL (ref 232–1245)

## 2024-09-08 LAB — VITAMIN D 25 HYDROXY (VIT D DEFICIENCY, FRACTURES): Vit D, 25-Hydroxy: 20.3 ng/mL — ABNORMAL LOW (ref 30.0–100.0)

## 2024-09-08 LAB — INSULIN, RANDOM: INSULIN: 18.9 u[IU]/mL (ref 2.6–24.9)

## 2024-09-08 LAB — T3: T3, Total: 122 ng/dL (ref 71–180)

## 2024-09-08 LAB — T4, FREE: Free T4: 0.99 ng/dL (ref 0.82–1.77)

## 2024-09-21 ENCOUNTER — Encounter (INDEPENDENT_AMBULATORY_CARE_PROVIDER_SITE_OTHER): Payer: Self-pay | Admitting: Family Medicine

## 2024-09-21 ENCOUNTER — Ambulatory Visit (INDEPENDENT_AMBULATORY_CARE_PROVIDER_SITE_OTHER): Admitting: Family Medicine

## 2024-09-21 VITALS — BP 101/71 | HR 91 | Temp 98.2°F | Ht 67.5 in | Wt 194.0 lb

## 2024-09-21 DIAGNOSIS — E669 Obesity, unspecified: Secondary | ICD-10-CM

## 2024-09-21 DIAGNOSIS — E782 Mixed hyperlipidemia: Secondary | ICD-10-CM | POA: Diagnosis not present

## 2024-09-21 DIAGNOSIS — Z6829 Body mass index (BMI) 29.0-29.9, adult: Secondary | ICD-10-CM | POA: Diagnosis not present

## 2024-09-21 DIAGNOSIS — E88819 Insulin resistance, unspecified: Secondary | ICD-10-CM

## 2024-09-21 DIAGNOSIS — E559 Vitamin D deficiency, unspecified: Secondary | ICD-10-CM | POA: Diagnosis not present

## 2024-09-21 DIAGNOSIS — E66811 Obesity, class 1: Secondary | ICD-10-CM | POA: Diagnosis not present

## 2024-09-21 MED ORDER — VITAMIN D (ERGOCALCIFEROL) 1.25 MG (50000 UNIT) PO CAPS: 50000.0000 [IU] | ORAL_CAPSULE | ORAL | 0 refills | Status: AC

## 2024-09-21 NOTE — Progress Notes (Signed)
 "  Bethany Perkins, D.O.  ABFM, ABOM Clinical Bariatric Medicine Physician  Office located at: [address] [place], [place]  [address]      FOR THE CHRONIC DISEASE OF OBESITY:  Chief complaint: Obesity Bethany Perkins is here to discuss her progress with her obesity treatment plan.   History of present illness / Interval history:  Bethany Perkins is here today for her first follow-up office visit since starting the program with us .  Since last OV, pt is up 1 lb. Patient states that the first week she did well but after that she hit a moment where it was difficult to follow the meal plan due to an increase in stress due to her job. She reports that her 3 kids had the stomach bug.     09/07/24 08:00 09/21/24 09:00   Body Fat % 36.7 % 36.3 %  Muscle Mass (lbs) 116.4 lbs 117.4 lbs  Fat Mass (lbs) 71.2 lbs 70.4 lbs  Total Body Water  (lbs) 78.6 lbs 78.2 lbs  Visceral Fat Rating  6 6    Counseling done today with patient on how various foods will affect these numbers and how to maximize fat loss success   Total lbs lost to date: +1 lbs Total Fat Mass in lbs lost to date: + 0.2 lbs Total weight loss percentage to date: + 0.52%    Class 1 obesity in adult, Starting BMI 29.9 Obesity-Current BMI 29.92  Nutrition Therapy She is on the Category 2 Plan with 100 snack calories   - Eating fresh, unprocessed foods? yes  - Eating lean proteins with each meal? yes  - Sleeping 7-9 Hours/ Night? no   - Skipping Meals? yes  - States she is following her eating plan approximately 80 % of the time.  Bethany Perkins is currently in the action stage of change. As such, her goal is to continue weight management plan.  She has agreed to: continue current reduced-calorie meal plan   Physical Activity Bethany Perkins is not exercising.  Bethany Perkins has been advised to work up to 300-450 minutes of moderate intensity aerobic activity a week and strengthening exercises 2-3 times per week for  cardiovascular health, weight loss maintenance and preservation of muscle mass.  She has agreed to : Think about enjoyable ways to increase daily physical activity and overcoming barriers to exercise   Behavioral Modifications Evidence-based interventions for health behavior change were utilized today including the discussion of  1) self monitoring techniques:    - Continue weighing and measuring lean proteins and veggies  Regarding patient's less desirable eating habits and patterns, we employed the technique of small changes.   We discussed the following today: avoiding skipping meals, reading food labels , planning for success, better snacking choices, and focusing on food with a 10:1 ratio of calories: grams of protein Additional resources provided today: Handout on recognition of cravings vs hunger; and emotional versus physical hunger, Handout on CAT 2 meal plan, and Handout on insulin  resistance education, Handout on Tuna Salad Recipe    Medical Interventions/ Pharmacotherapy Previous Bariatric surgery: n/a Pharmacotherapy for weight loss: She is not currently taking medications  for medical weight loss.    We discussed various medication options to help Ahtziry with her weight loss efforts and we both agreed to : Continue with current nutritional and behavioral strategies   OBESITY RELATED CONDITIONS ADDRESSED TODAY: Extensive review of labs performed today that were drawn at the new patient office visit on 09/07/24.  Mixed hyperlipidemia Assessment & Plan Lab Results  Component Value Date   CHOL 238 (H) 09/07/2024   HDL 40 09/07/2024   LDLCALC 167 (H) 09/07/2024   LDLDIRECT 128.0 07/12/2020   TRIG 166 (H) 09/07/2024   CHOLHDL 6.0 (H) 09/07/2024    Diet and lifestyle controlled. Patients trig levels are elevated this could be due fatty carbs due to diet. LDL levels are also not at goal. Explained to patient that once levels are higher than 170 we recommend that patients  start medications. HDL levels is in the normal range and as patient increase exercise HDL levels will increase as well. Discussed with patient early coronary artery disease and patient states that she is unsure if she has any family history. Recommended that patient reach out to family and ask if there is history. Patient wishes to hold off until next labs to see if she can change with her diet and life stye before she starts medications. Will reassess at next OV.     Insulin  resistance - new diagnosis Assessment & Plan Lab Results  Component Value Date   HGBA1C 5.3 09/07/2024   INSULIN  18.9 09/07/2024    Lab Results  Component Value Date   TSH 1.560 09/07/2024   T3TOTAL 122 09/07/2024   FREET4 0.99 09/07/2024      Component Value Date/Time   NA 141 09/07/2024 1021   K 5.2 09/07/2024 1021   CL 104 09/07/2024 1021   CO2 22 09/07/2024 1021   GLUCOSE 95 09/07/2024 1021   GLUCOSE 94 05/03/2020 0801   BUN 12 09/07/2024 1021   CREATININE 0.76 09/07/2024 1021   CALCIUM 9.6 09/07/2024 1021   PROT 7.1 09/07/2024 1021   ALBUMIN 4.6 09/07/2024 1021   AST 13 09/07/2024 1021   ALT 12 09/07/2024 1021   ALKPHOS 74 09/07/2024 1021   BILITOT 0.4 09/07/2024 1021   GFR 87.59 05/03/2020 0801   EGFR 107 09/07/2024 1021      Component Value Date/Time   WBC 7.9 09/07/2024 1021   WBC 15.9 (H) 09/01/2021 0449   RBC 4.79 09/07/2024 1021   RBC 3.46 (L) 09/01/2021 0449   HGB 13.7 09/07/2024 1021   HCT 42.1 09/07/2024 1021   PLT 418 09/07/2024 1021   MCV 88 09/07/2024 1021   MCH 28.6 09/07/2024 1021   MCH 26.6 09/01/2021 0449   MCHC 32.5 09/07/2024 1021   MCHC 31.8 09/01/2021 0449   RDW 13.6 09/07/2024 1021   LYMPHSABS 1.9 09/07/2024 1021   MONOABS 0.6 05/03/2020 0801   EOSABS 0.2 09/07/2024 1021   BASOSABS 0.1 09/07/2024 1021   Patients A1C levels are WNL no acute concerns. Insulin  levels are elevated. Goal levels are less than 10 current levels are 18.9. Explained to patient how insulin   resistance works and how it effects her body and her cravings signals. How an increase in her adipose tissue meals there will be an increase in her insulin  resistance. Explained to patient how protein helps stabilize insulin  levels and reduce cravings. Patient states that she is not craving sodas and if she ate off plan it made her feel bad. She reports drinking some sweet tea. Continue following prudent meal plan and decreasing simple carbs and sugars.   Patients thyroid levels are at goal no acute concerns. CMP, liver enzymes and CBC are WNL. Continue following meal plan.     Vitamin D  deficiency - new onset Assessment & Plan Lab Results  Component Value Date   VD25OH 20.3 (L) 09/07/2024  Lab Results  Component Value Date   VITAMINB12 540 09/07/2024   FOLATE 14.8 09/07/2024    Discussed the importance of vitamin D  to the patient's health and well-being as well as to their ability to lose weight. Reviewed possible symptoms of low Vitamin D :  low energy, depressed mood, muscle aches, joint aches, osteoporosis etc. with patient. Explained to patient goal Vit D levels are above 40. Mutually agreed to Begin ERGO 50K units once weekly. Will recheck in 3-4 months.   B12 levels are at goal. No aute concerns today. Explained to patient the importance of at goal B12 levels. Continue eating healthy meal plan and leafy greens. Will reassess in 3-4 months.    Meds ordered this encounter  Medications   Vitamin D , Ergocalciferol , (DRISDOL ) 1.25 MG (50000 UNIT) CAPS capsule    Sig: Take 1 capsule (50,000 Units total) by mouth every 7 (seven) days.    Dispense:  12 capsule    Refill:  0     FOLLOW UP:   Return 10/05/2024 at 11:00 AM  She was informed of the importance of frequent follow up visits to maximize her success with intensive lifestyle modifications for her multiple health conditions.   Weight Summary and Biometrics   Weight Lost Since Last Visit: 0  Weight Gained Since Last  Visit: 1   Vitals Temp: 98.2 F (36.8 C) BP: 101/71 Pulse Rate: 91 SpO2: 100 %   Anthropometric Measurements Height: 5' 7.5 (1.715 m) Weight: 194 lb (88 kg) BMI (Calculated): 29.92 Weight at Last Visit: 193lb Weight Lost Since Last Visit: 0 Weight Gained Since Last Visit: 1 Starting Weight: 193lb Total Weight Loss (lbs): 0 lb (0 kg) Peak Weight: 203lb   Body Composition  Body Fat %: 36.3 % Fat Mass (lbs): 70.4 lbs Muscle Mass (lbs): 117.4 lbs Total Body Water  (lbs): 78.2 lbs Visceral Fat Rating : 6   Other Clinical Data Fasting: no Labs: no Today's Visit #: 2 Starting Date: 09/07/24 Comments: NP FU     Objective:  Review of Systems:  Pertinent positives were addressed with patient today.   Reviewed by clinician on day of visit: allergies, medications, problem list, medical history, surgical history, family history, social history, and previous encounter notes.  PHYSICAL EXAM: Blood pressure 101/71, pulse 91, temperature 98.2 F (36.8 C), height 5' 7.5 (1.715 m), weight 194 lb (88 kg), last menstrual period 08/29/2024, SpO2 100%. Body mass index is 29.94 kg/m. General: she is overweight, cooperative and in no acute distress.   HEENT: EOMI, sclerae are anicteric. Lungs: Normal breathing effort, no conversational dyspnea. M-Sk:  Normal gross ROM * 4 extremities  PSYCH: Has normal mood, affect and thought process. Neurologic: No gross sensory or motor deficits. Well developed, A and O * 3  DIAGNOSTIC DATA REVIEWED:  BMET    Component Value Date/Time   NA 141 09/07/2024 1021   K 5.2 09/07/2024 1021   CL 104 09/07/2024 1021   CO2 22 09/07/2024 1021   GLUCOSE 95 09/07/2024 1021   GLUCOSE 94 05/03/2020 0801   BUN 12 09/07/2024 1021   CREATININE 0.76 09/07/2024 1021   CALCIUM 9.6 09/07/2024 1021   Lab Results  Component Value Date   HGBA1C 5.3 09/07/2024   Lab Results  Component Value Date   INSULIN  18.9 09/07/2024   Lab Results  Component  Value Date   TSH 1.560 09/07/2024   CBC    Component Value Date/Time   WBC 7.9 09/07/2024 1021   WBC 15.9 (H)  09/01/2021 0449   RBC 4.79 09/07/2024 1021   RBC 3.46 (L) 09/01/2021 0449   HGB 13.7 09/07/2024 1021   HCT 42.1 09/07/2024 1021   PLT 418 09/07/2024 1021   MCV 88 09/07/2024 1021   MCH 28.6 09/07/2024 1021   MCH 26.6 09/01/2021 0449   MCHC 32.5 09/07/2024 1021   MCHC 31.8 09/01/2021 0449   RDW 13.6 09/07/2024 1021   Iron  Studies No results found for: IRON , TIBC, FERRITIN, IRONPCTSAT Lipid Panel     Component Value Date/Time   CHOL 238 (H) 09/07/2024 1021   TRIG 166 (H) 09/07/2024 1021   HDL 40 09/07/2024 1021   CHOLHDL 6.0 (H) 09/07/2024 1021   CHOLHDL 6 07/12/2020 0800   LDLCALC 167 (H) 09/07/2024 1021   LDLDIRECT 128.0 07/12/2020 0800   Hepatic Function Panel     Component Value Date/Time   PROT 7.1 09/07/2024 1021   ALBUMIN 4.6 09/07/2024 1021   AST 13 09/07/2024 1021   ALT 12 09/07/2024 1021   ALKPHOS 74 09/07/2024 1021   BILITOT 0.4 09/07/2024 1021      Component Value Date/Time   TSH 1.560 09/07/2024 1021   Nutritional Lab Results  Component Value Date   VD25OH 20.3 (L) 09/07/2024     Attestations:   LILLETTE Sonny Laroche, acting as a medical scribe for Bethany Jenkins, DO., have compiled all relevant documentation for today's office visit on behalf of Bethany Jenkins, DO, while in the presence of [employer], DO.  I have spent 56 minutes in the care of the patient today.  46 minutes was spent on face-to-face counseling and reviewing listed medical problems above as outlined in office visit note, providing nutritional and behavioral counseling as outlined in obesity care plan, independently interpreting results and goals of care, see listed medical problems, and discussing biometric information and progress. We reviewed her meal plan and discussed how the foods she's eating is affecting each one of her labs. Pt educated on why we want  her to eat various foods in various amounts and has a better understanding of the nutritional plan because of this. All questions were answered today. 10 minutes was spent on pre-chart review and additional documentation after the visit.   I have reviewed the above documentation for accuracy and completeness, and I agree with the above. Bethany JINNY Perkins, D.O.  The 21st Century Cures Act was signed into law in 2016 which includes the topic of electronic health records.  This provides immediate access to information in MyChart.  This includes consultation notes, operative notes, office notes, lab results and pathology reports.  If you have any questions about what you read please let us  know at your next visit so we can discuss your concerns and take corrective action if need be.  We are right here with you. "

## 2024-10-05 ENCOUNTER — Ambulatory Visit (INDEPENDENT_AMBULATORY_CARE_PROVIDER_SITE_OTHER): Admitting: Family Medicine
